# Patient Record
Sex: Male | Born: 1955 | Race: Black or African American | Hispanic: No | Marital: Married | Smoking: Current every day smoker
Health system: Southern US, Community
[De-identification: ages and names within clinical notes are randomized; demographics above are authoritative.]

## PROBLEM LIST (undated history)

## (undated) DIAGNOSIS — M1990 Unspecified osteoarthritis, unspecified site: Secondary | ICD-10-CM

## (undated) DIAGNOSIS — K469 Unspecified abdominal hernia without obstruction or gangrene: Secondary | ICD-10-CM

## (undated) DIAGNOSIS — M109 Gout, unspecified: Secondary | ICD-10-CM

## (undated) DIAGNOSIS — E785 Hyperlipidemia, unspecified: Secondary | ICD-10-CM

## (undated) DIAGNOSIS — E8881 Metabolic syndrome: Secondary | ICD-10-CM

## (undated) DIAGNOSIS — E88819 Insulin resistance, unspecified: Secondary | ICD-10-CM

## (undated) DIAGNOSIS — T7840XA Allergy, unspecified, initial encounter: Secondary | ICD-10-CM

## (undated) HISTORY — DX: Allergy, unspecified, initial encounter: T78.40XA

## (undated) HISTORY — DX: Insulin resistance, unspecified: E88.819

## (undated) HISTORY — DX: Unspecified abdominal hernia without obstruction or gangrene: K46.9

## (undated) HISTORY — DX: Unspecified osteoarthritis, unspecified site: M19.90

## (undated) HISTORY — DX: Gout, unspecified: M10.9

## (undated) HISTORY — DX: Hyperlipidemia, unspecified: E78.5

## (undated) HISTORY — DX: Metabolic syndrome: E88.81

---

## 1998-05-11 ENCOUNTER — Ambulatory Visit (HOSPITAL_COMMUNITY): Admission: RE | Admit: 1998-05-11 | Discharge: 1998-05-11 | Payer: Self-pay | Admitting: Internal Medicine

## 1999-10-29 ENCOUNTER — Emergency Department (HOSPITAL_COMMUNITY): Admission: EM | Admit: 1999-10-29 | Discharge: 1999-10-29 | Payer: Self-pay

## 2001-05-02 ENCOUNTER — Encounter: Payer: Self-pay | Admitting: Internal Medicine

## 2001-05-02 ENCOUNTER — Encounter: Admission: RE | Admit: 2001-05-02 | Discharge: 2001-05-02 | Payer: Self-pay | Admitting: Internal Medicine

## 2001-05-21 ENCOUNTER — Ambulatory Visit (HOSPITAL_COMMUNITY): Admission: RE | Admit: 2001-05-21 | Discharge: 2001-05-21 | Payer: Self-pay | Admitting: Internal Medicine

## 2001-07-02 HISTORY — PX: HERNIA REPAIR: SHX51

## 2003-08-25 ENCOUNTER — Encounter: Admission: RE | Admit: 2003-08-25 | Discharge: 2003-08-25 | Payer: Self-pay | Admitting: Internal Medicine

## 2004-09-11 ENCOUNTER — Ambulatory Visit (HOSPITAL_COMMUNITY): Admission: RE | Admit: 2004-09-11 | Discharge: 2004-09-11 | Payer: Self-pay | Admitting: Cardiology

## 2005-04-25 ENCOUNTER — Ambulatory Visit (HOSPITAL_COMMUNITY): Admission: RE | Admit: 2005-04-25 | Discharge: 2005-04-25 | Payer: Self-pay | Admitting: General Surgery

## 2006-03-20 ENCOUNTER — Encounter: Admission: RE | Admit: 2006-03-20 | Discharge: 2006-03-20 | Payer: Self-pay | Admitting: Internal Medicine

## 2006-12-09 ENCOUNTER — Encounter: Admission: RE | Admit: 2006-12-09 | Discharge: 2006-12-09 | Payer: Self-pay | Admitting: Internal Medicine

## 2010-10-13 ENCOUNTER — Encounter: Payer: Self-pay | Admitting: Internal Medicine

## 2010-11-17 NOTE — Op Note (Signed)
Tyler Perry, Tyler Perry              ACCOUNT NO.:  [id number]   MEDICAL RECORD NO.:  [id number]          PATIENT TYPE:  AMB   LOCATION:  DAY                          FACILITY:  [facility]   PHYSICIAN:  Leonie Man, M.D.   DATE OF BIRTH:  05/03/1956   DATE OF PROCEDURE:  04/25/2005  DATE OF DISCHARGE:                                 OPERATIVE REPORT   PREOPERATIVE DIAGNOSIS:  1.  Right inguinal hernia.  2.  Umbilical hernia   POSTOPERATIVE DIAGNOSIS:  1.  Right inguinal hernia.  2.  Umbilical hernia   PROCEDURE:  1.  Right inguinal hernia repair with mesh using a Prolene hernia system.  2.  Umbilical hernia repair with mesh using the Davol system.   SURGEON:  Leonie Man, M.D.   ASSISTANT:  OR tech.   ANESTHESIA:  General.   SPECIMENS:  No specimens to lab.   ESTIMATED BLOOD LOSS:  Minimal.   COMPLICATIONS:  There no complications during the procedure, and the patient  returned to the PACU in good condition.   INDICATIONS:  The patient is a 55 year old man presenting with a right-sided  groin bulge which has been minimally symptomatic but enlarging and  prolapsing.  He comes to the operating room.  On examination he was also  found to have an incarcerated umbilical hernia.  This has not been causing  any significant symptoms, however.  He understands the risks and potential  benefits of surgery and gives his consent to same.   DESCRIPTION OF PROCEDURE:  Following induction of satisfactory general  anesthesia with the patient positioned supinely, the abdomen is prepped and  draped to be included in a sterile operative field. The right groin was  approached through right lower quadrant incision placed in the lower  abdominal crease, deepened through skin and subcutaneous tissue, down to the  external oblique aponeurosis.  External oblique aponeurosis is opened and  entered with retraction of the ilioinguinal nerve laterally and inferiorly.  The spermatic cord was  elevated and held with a Penrose drain.  On  dissection within the spermatic cord and on the lateral aspect of the  inferior epigastric vessels, there was a moderate size inguinal hernia sac  which was dissected free from the surrounding cord tissues up into the  internal ring.  The sac is reduced into the retroperitoneum and dissection  carried down to the region of the epigastric vessels which were held with an  Allis clamp.  The preperitoneal fat was then dissected free, and the  preperitoneal space entered.  I used a large Prolene hernia system mesh  which was then deployed into the retroperitoneum and in the inferior,  superior, lateral and medial positions.  The external portion was then  opened up to the inguinal ring, and the mesh was cut so as to allow  protrusion of the spermatic cord through the mesh.  The mesh was then  sutured down to the cubic tubercle with 2-0 Novofil suture running the  suture line up along the conjoined tendon to the internal ring and again  from the pubic tubercle along the shelving  edge of Poupart's ligament up to  the internal ring.  The remaining portion of the mesh was then deployed  under the external aponeurosis.  All areas of dissection were then checked  for hemostasis and noted be dry. Sponge, instrument and sharp counts were  verified. External oblique aponeurosis was  closed over the cord with a  running 2-0 Vicryl suture.  The Scarpa's fascia was closed with a running 3-  0 Vicryl suture, and the skin was closed with a running 4-0 Monocryl suture  placed intradermally.   Attention was then turned to the umbilical hernia which was then approached  through an infraumbilical curvilinear incision.  The umbilicus was raised  superiorly as a flap and dissection carried down to the defect in the  abdominal wall.  This was a rather large defect causing needing dissection  both superiorly, laterally and inferiorly.  All of the incarcerated omentum   were was reduced back into the peritoneal cavity and all of the l adhesions  from the hernia sac were taken down.  I deployed a Dietitian  into the peritoneum and closed the defect over the patch off to the extruded  tapes.  The tapes were then cut and sutured into the fascia.  This completed  the repair of the hernia. Sponge, instrument and sharp counts were again  verified and the wound was closed in layers as follows. The subcutaneous  tissue were closed with interrupted 3-0 Vicryl sutures. Skin closed with 4-0  Monocryl suture.  A sterile compressive dressing was then placed on the  wound after Steri-Strips were placed on both incisions.      Leonie Man, M.D.  Electronically Signed     PB/MEDQ  D:  04/25/2005  T:  04/25/2005  Job:  [id number]

## 2011-01-23 ENCOUNTER — Ambulatory Visit
Admission: RE | Admit: 2011-01-23 | Discharge: 2011-01-23 | Disposition: A | Discharge status: Home / Self Care | Payer: BC Managed Care – PPO | Source: Ambulatory Visit | Attending: Internal Medicine | Admitting: Internal Medicine

## 2011-01-23 ENCOUNTER — Other Ambulatory Visit: Payer: Self-pay | Admitting: Internal Medicine

## 2011-01-23 DIAGNOSIS — R05 Cough: Secondary | ICD-10-CM

## 2011-01-23 DIAGNOSIS — R059 Cough, unspecified: Secondary | ICD-10-CM

## 2011-04-17 ENCOUNTER — Other Ambulatory Visit: Payer: Self-pay | Admitting: Internal Medicine

## 2011-04-17 DIAGNOSIS — K458 Other specified abdominal hernia without obstruction or gangrene: Secondary | ICD-10-CM

## 2011-04-19 ENCOUNTER — Other Ambulatory Visit: Payer: BC Managed Care – PPO

## 2011-04-20 ENCOUNTER — Ambulatory Visit
Admission: RE | Admit: 2011-04-20 | Discharge: 2011-04-20 | Disposition: A | Discharge status: Home / Self Care | Payer: BC Managed Care – PPO | Source: Ambulatory Visit | Attending: Internal Medicine | Admitting: Internal Medicine

## 2011-04-20 DIAGNOSIS — K458 Other specified abdominal hernia without obstruction or gangrene: Secondary | ICD-10-CM

## 2011-06-04 ENCOUNTER — Encounter (INDEPENDENT_AMBULATORY_CARE_PROVIDER_SITE_OTHER): Payer: BC Managed Care – PPO | Admitting: General Surgery

## 2011-07-09 ENCOUNTER — Encounter (INDEPENDENT_AMBULATORY_CARE_PROVIDER_SITE_OTHER): Payer: BC Managed Care – PPO | Admitting: General Surgery

## 2011-08-13 ENCOUNTER — Encounter (INDEPENDENT_AMBULATORY_CARE_PROVIDER_SITE_OTHER): Payer: BC Managed Care – PPO | Admitting: General Surgery

## 2011-08-27 ENCOUNTER — Encounter (INDEPENDENT_AMBULATORY_CARE_PROVIDER_SITE_OTHER): Payer: BC Managed Care – PPO | Admitting: General Surgery

## 2011-08-29 ENCOUNTER — Encounter (INDEPENDENT_AMBULATORY_CARE_PROVIDER_SITE_OTHER): Payer: Self-pay | Admitting: General Surgery

## 2011-08-29 ENCOUNTER — Ambulatory Visit (INDEPENDENT_AMBULATORY_CARE_PROVIDER_SITE_OTHER): Payer: BC Managed Care – PPO | Admitting: General Surgery

## 2011-08-29 VITALS — BP 136/88 | HR 72 | Temp 98.0°F | Resp 16 | Ht 68.0 in | Wt 194.4 lb

## 2011-08-29 DIAGNOSIS — K439 Ventral hernia without obstruction or gangrene: Secondary | ICD-10-CM

## 2011-08-29 NOTE — Progress Notes (Signed)
Patient ID: Tyler Perry, male   DOB: 08/25/1955, 56 y.o.   MRN: [id number]  Chief Complaint  Patient presents with  . Hernia    est pr new prob- eval abd wall hernia    HPI Tyler Perry is a 56 y.o. male.   HPI  He is referred by Dr. Willey Blade for evaluation of abdominal wall hernias. Back in the fall he developed some severe epigastric pain. He underwent a CT scan which demonstrated 2 small epigastric ventral hernias containing fat. Since that episode he has not had near as much pain. He has some constipation but is now taking MiraLax and that has improved. He has had an umbilical hernia apparent the past. No obstructive symptoms. No difficulty with urination. No chronic cough. He does a lot of heavy lifting at work and used to work out a lot at [employer]  Past Medical History  Diagnosis Date  . Hyperlipidemia   . Gout, unspecified   . Allergy   . Osteoarthrosis, unspecified whether generalized or localized, unspecified site   . Hernia     Past Surgical History  Procedure Date  . Hernia repair     umb hernia    Family History  Problem Relation Age of Onset  . Cancer Mother     bone  . Heart disease Father   . Cancer Maternal Aunt     unaware    Social History History  Substance Use Topics  . Smoking status: Former Smoker    Quit date: 07/03/2003  . Smokeless tobacco: Not on file  . Alcohol Use: No    No Known Allergies  Current Outpatient Prescriptions  Medication Sig Dispense Refill  . allopurinol (ZYLOPRIM) 300 MG tablet Take 300 mg by mouth daily.        Marland Kitchen aspirin 81 MG tablet Take 81 mg by mouth daily.        Marland Kitchen azelastine (ASTELIN) 137 MCG/SPRAY nasal spray BID times 48H.      . cetirizine (ZYRTEC) 10 MG tablet Take 10 mg by mouth daily.        Marland Kitchen lovastatin (MEVACOR) 40 MG tablet Take 40 mg by mouth daily.        . Multiple Vitamin (MULTIVITAMIN) tablet Take 1 tablet by mouth daily.          Review of Systems Review of Systems  Constitutional:  Negative.   Respiratory: Negative.   Cardiovascular: Negative.   Gastrointestinal: Positive for abdominal pain, constipation and abdominal distention (sees small bulges sometimes).  Genitourinary: Negative.   Neurological: Negative.   Hematological: Negative.     Blood pressure 136/88, pulse 72, temperature 98 F (36.7 C), temperature source Temporal, resp. rate 16, height 5\' 8"  (1.727 m), weight 194 lb 6.4 oz (88.179 kg).  Physical Exam Physical Exam  Constitutional:       Overweight male in NAD.  HENT:  Head: Normocephalic and atraumatic.  Cardiovascular: Normal rate and regular rhythm.   Pulmonary/Chest: Effort normal and breath sounds normal.  Abdominal: Soft. He exhibits no distension and no mass. There is no tenderness.       There is a small subumbilical scar present.  Superior to the umbilicus there are 2 palpable fascial defects with reducible material.  Musculoskeletal: Normal range of motion. He exhibits no edema.    Data Reviewed Dr. Diamantina Providence note.  CT scan.  Assessment    2 ventral epigastric hernias that led to a symptomatic episode. These both contained fat and  are not incarcerated. He is interested in repair.    Plan    We discussed laparoscopic and open ventral hernia repairs using a mesh. He is interested in a laparoscopic repair. He would like to talk to Dr. August Saucer about this. I told him he could call back if he wanted to schedule the repair.  I have discussed the procedure, risks, and aftercare. Risks include but are not limited to bleeding, infection, wound healing problems, anesthesia, recurrence, accidental injury to intra-abdominal organs-such as intestine, liver, spleen, bladder, etc.   All questions were answered.       Felesha Moncrieffe J 08/29/2011, 11:17 AM

## 2011-08-29 NOTE — Patient Instructions (Signed)
Call us when you would like to schedule your surgery.

## 2011-10-01 ENCOUNTER — Telehealth (INDEPENDENT_AMBULATORY_CARE_PROVIDER_SITE_OTHER): Payer: Self-pay | Admitting: General Surgery

## 2011-10-03 ENCOUNTER — Telehealth (INDEPENDENT_AMBULATORY_CARE_PROVIDER_SITE_OTHER): Payer: Self-pay | Admitting: General Surgery

## 2011-10-10 ENCOUNTER — Other Ambulatory Visit (INDEPENDENT_AMBULATORY_CARE_PROVIDER_SITE_OTHER): Payer: Self-pay | Admitting: General Surgery

## 2011-10-15 ENCOUNTER — Telehealth (INDEPENDENT_AMBULATORY_CARE_PROVIDER_SITE_OTHER): Payer: Self-pay

## 2011-10-15 NOTE — Telephone Encounter (Signed)
LMOV pt to call when ready to schedule surgery sometime in August.  He will need to be seen for an office visit with Dr. Abbey Chatters at that time.

## 2012-05-19 ENCOUNTER — Ambulatory Visit
Admission: RE | Admit: 2012-05-19 | Discharge: 2012-05-19 | Disposition: A | Discharge status: Home / Self Care | Payer: BC Managed Care – PPO | Source: Ambulatory Visit | Attending: Internal Medicine | Admitting: Internal Medicine

## 2012-05-19 ENCOUNTER — Other Ambulatory Visit: Payer: Self-pay | Admitting: Internal Medicine

## 2012-05-19 DIAGNOSIS — M542 Cervicalgia: Secondary | ICD-10-CM

## 2012-05-19 DIAGNOSIS — J40 Bronchitis, not specified as acute or chronic: Secondary | ICD-10-CM

## 2012-08-26 ENCOUNTER — Ambulatory Visit (INDEPENDENT_AMBULATORY_CARE_PROVIDER_SITE_OTHER): Payer: BC Managed Care – PPO | Admitting: Physician Assistant

## 2012-08-26 VITALS — BP 149/84 | HR 68 | Temp 98.5°F | Resp 16 | Ht 67.0 in | Wt 197.6 lb

## 2012-08-26 DIAGNOSIS — R05 Cough: Secondary | ICD-10-CM

## 2012-08-26 DIAGNOSIS — J019 Acute sinusitis, unspecified: Secondary | ICD-10-CM

## 2012-08-26 DIAGNOSIS — R059 Cough, unspecified: Secondary | ICD-10-CM

## 2012-08-26 MED ORDER — AMOXICILLIN 875 MG PO TABS: 875.0000 mg | ORAL_TABLET | Freq: Two times a day (BID) | ORAL | Status: DC

## 2012-08-26 MED ORDER — IPRATROPIUM BROMIDE 0.03 % NA SOLN: 2.0000 | Freq: Two times a day (BID) | NASAL | Status: DC

## 2012-08-26 MED ORDER — HYDROCODONE-HOMATROPINE 5-1.5 MG/5ML PO SYRP: 5.0000 mL | ORAL_SOLUTION | Freq: Three times a day (TID) | ORAL | Status: DC | PRN

## 2012-08-26 NOTE — Progress Notes (Signed)
  Subjective:    Patient ID: Tyler Perry, male    DOB: Mar 16, 1956, 57 y.o.   MRN: [id number]  HPI 57 year old male presents with 5 day history of nasal congestion, sinus pressure, postnasal drainage, clear rhinorrhea, and nonproductive cough.  He has been taking Mucinex and cetirizine which does not seem to be helping much.  Denies nausea, vomiting, dizziness, headache, otalgia, sore throat or sinus pain.       Review of Systems  Constitutional: Negative for fever and chills.  HENT: Positive for congestion, rhinorrhea, postnasal drip and sinus pressure. Negative for ear pain, sore throat and ear discharge.   Respiratory: Positive for cough. Negative for shortness of breath and wheezing.   Gastrointestinal: Negative for nausea, vomiting and abdominal pain.  Neurological: Negative for dizziness, light-headedness and headaches.       Objective:   Physical Exam  Constitutional: He is oriented to person, place, and time. He appears well-developed and well-nourished.  HENT:  Head: Normocephalic and atraumatic.  Right Ear: Hearing, tympanic membrane, external ear and ear canal normal.  Left Ear: Hearing, tympanic membrane, external ear and ear canal normal.  Mouth/Throat: Uvula is midline, oropharynx is clear and moist and mucous membranes are normal. No oropharyngeal exudate.  Eyes: Conjunctivae are normal.  Neck: Normal range of motion. Neck supple.  Cardiovascular: Normal rate, regular rhythm and normal heart sounds.   Pulmonary/Chest: Effort normal and breath sounds normal.  Lymphadenopathy:    He has no cervical adenopathy.  Neurological: He is alert and oriented to person, place, and time.  Psychiatric: He has a normal mood and affect. His behavior is normal. Judgment and thought content normal.          Assessment & Plan:  1. Sinusitis, acute - Plan: amoxicillin (AMOXIL) 875 MG tablet, ipratropium (ATROVENT) 0.03 % nasal spray  -Amoxicillin 875 mg bid x 10 days  -Atrovent  NS bid to help with congestion  -Continue Zyrtec daily 2. Cough - Plan: HYDROcodone-homatropine (HYCODAN) 5-1.5 MG/5ML syrup  -Hycodan qhs prn cough  -Cautioned of sedating effects  -Follow up if symptoms worsen or fail to improve.

## 2012-09-01 ENCOUNTER — Ambulatory Visit (INDEPENDENT_AMBULATORY_CARE_PROVIDER_SITE_OTHER): Payer: BC Managed Care – PPO | Admitting: General Surgery

## 2013-06-10 ENCOUNTER — Ambulatory Visit (INDEPENDENT_AMBULATORY_CARE_PROVIDER_SITE_OTHER): Payer: BC Managed Care – PPO | Admitting: Physician Assistant

## 2013-06-10 VITALS — BP 110/72 | HR 76 | Temp 98.6°F | Resp 18 | Ht 67.0 in | Wt 188.0 lb

## 2013-06-10 DIAGNOSIS — J019 Acute sinusitis, unspecified: Secondary | ICD-10-CM

## 2013-06-10 DIAGNOSIS — R05 Cough: Secondary | ICD-10-CM

## 2013-06-10 DIAGNOSIS — R059 Cough, unspecified: Secondary | ICD-10-CM

## 2013-06-10 MED ORDER — AMOXICILLIN 875 MG PO TABS: 875.0000 mg | ORAL_TABLET | Freq: Two times a day (BID) | ORAL | Status: AC

## 2013-06-10 MED ORDER — HYDROCODONE-HOMATROPINE 5-1.5 MG/5ML PO SYRP: 5.0000 mL | ORAL_SOLUTION | Freq: Three times a day (TID) | ORAL | Status: DC | PRN

## 2013-06-10 NOTE — Patient Instructions (Signed)
Continue the cetirizine and Astelin nasal spray. Restart the Mucinex to help thin the mucous. Get plenty of rest and drink at least 64 ounces of water daily.

## 2013-06-10 NOTE — Progress Notes (Signed)
   Subjective:    Patient ID: Tyler Perry, male    DOB: 1956/02/14, 57 y.o.   MRN: [id number]  PCP: Willey Blade, MD  Chief Complaint  Patient presents with  . Cough    chest pain with coughing started sunday    Medications, allergies, past medical history, surgical history, family history, social history and problem list reviewed and updated.  HPI Symptoms began on 06/07/2013.  37 year old grandson had similar symptoms.  Traveled to [facility] on 06/08/2013 and back 12/09 (last night).  Is feeling worse.  Nasal congestion, drainage, sore throat.  No ear pain or fullness. Cough is non-productive. Yesterday the cough was painful, today it's some better. HA yesterday, none today. No fever, chills, nausea, vomiting.  A little bit of diarrhea this morning. No unexplained myalgias/arthralgias, but feels stiff after the travel.   Review of Systems As above.    Objective:   Physical Exam  Vitals reviewed. Constitutional: He is oriented to person, place, and time. Vital signs are normal. He appears well-developed and well-nourished. No distress.  HENT:  Head: Normocephalic and atraumatic.  Right Ear: Hearing, tympanic membrane, external ear and ear canal normal.  Left Ear: Hearing, tympanic membrane, external ear and ear canal normal.  Nose: Mucosal edema and rhinorrhea present.  No foreign bodies. Right sinus exhibits no maxillary sinus tenderness and no frontal sinus tenderness. Left sinus exhibits no maxillary sinus tenderness and no frontal sinus tenderness.  Mouth/Throat: Uvula is midline, oropharynx is clear and moist and mucous membranes are normal. No uvula swelling. No oropharyngeal exudate.  Eyes: Conjunctivae and EOM are normal. Pupils are equal, round, and reactive to light. Right eye exhibits no discharge. Left eye exhibits no discharge. No scleral icterus.  Neck: Trachea normal, normal range of motion and full passive range of motion without pain. Neck supple. No mass and  no thyromegaly present.  Cardiovascular: Normal rate, regular rhythm and normal heart sounds.   Pulmonary/Chest: Effort normal and breath sounds normal.  Lymphadenopathy:       Head (right side): No submandibular, no tonsillar, no preauricular, no posterior auricular and no occipital adenopathy present.       Head (left side): No submandibular, no tonsillar, no preauricular and no occipital adenopathy present.    He has no cervical adenopathy.       Right: No supraclavicular adenopathy present.       Left: No supraclavicular adenopathy present.  Neurological: He is alert and oriented to person, place, and time. He has normal strength. No cranial nerve deficit or sensory deficit.  Skin: Skin is warm, dry and intact. No rash noted.  Psychiatric: He has a normal mood and affect. His speech is normal and behavior is normal.          Assessment & Plan:  1. Sinusitis, acute - amoxicillin (AMOXIL) 875 MG tablet; Take 1 tablet (875 mg total) by mouth 2 (two) times daily.  Dispense: 20 tablet; Refill: 0  2. Cough - HYDROcodone-homatropine (HYCODAN) 5-1.5 MG/5ML syrup; Take 5 mLs by mouth every 8 (eight) hours as needed for cough.  Dispense: 120 mL; Refill: 0  Supportive care.  Anticipatory guidance.  RTC if symptoms worsen/persist.  Fernande Bras, PA-C Physician Assistant-Certified [facility]

## 2013-06-18 ENCOUNTER — Telehealth: Payer: Self-pay

## 2013-06-18 NOTE — Telephone Encounter (Signed)
FMLA paperwork in Tyler Perry's box to be completed.

## 2013-06-29 NOTE — Telephone Encounter (Signed)
[facility] ready for pickup. Faxed one page but patient needs to complete other page. LMOM.

## 2013-07-27 DIAGNOSIS — Z0271 Encounter for disability determination: Secondary | ICD-10-CM

## 2013-08-06 ENCOUNTER — Encounter: Payer: Self-pay | Admitting: Physician Assistant

## 2014-01-10 IMAGING — CR DG CHEST 2V
2 series · 2 of 2 positions shown · non-contrast
Comparison: Chest x-ray of 01/23/1999

CLINICAL DATA: Recent aspiration, residual cough and congestion

CHEST - 2 VIEW

[view not recorded (1 of 2)]
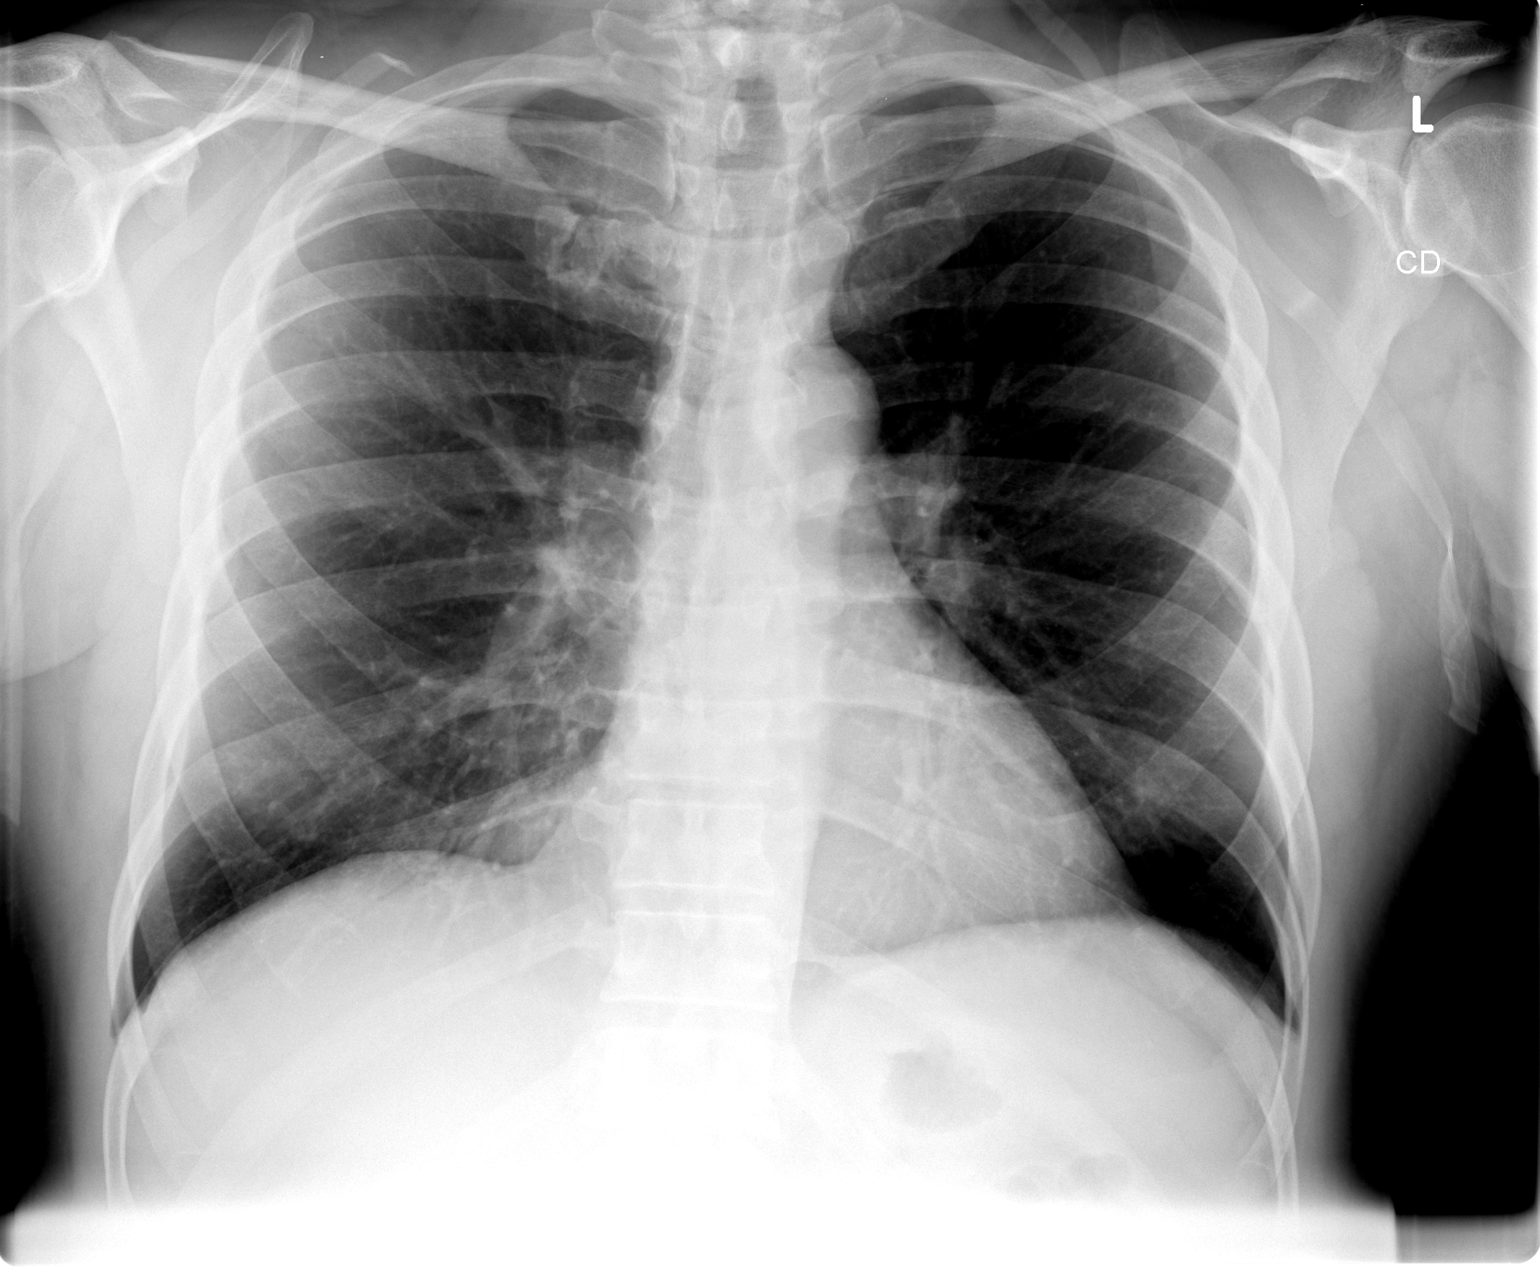

[view not recorded (2 of 2)]
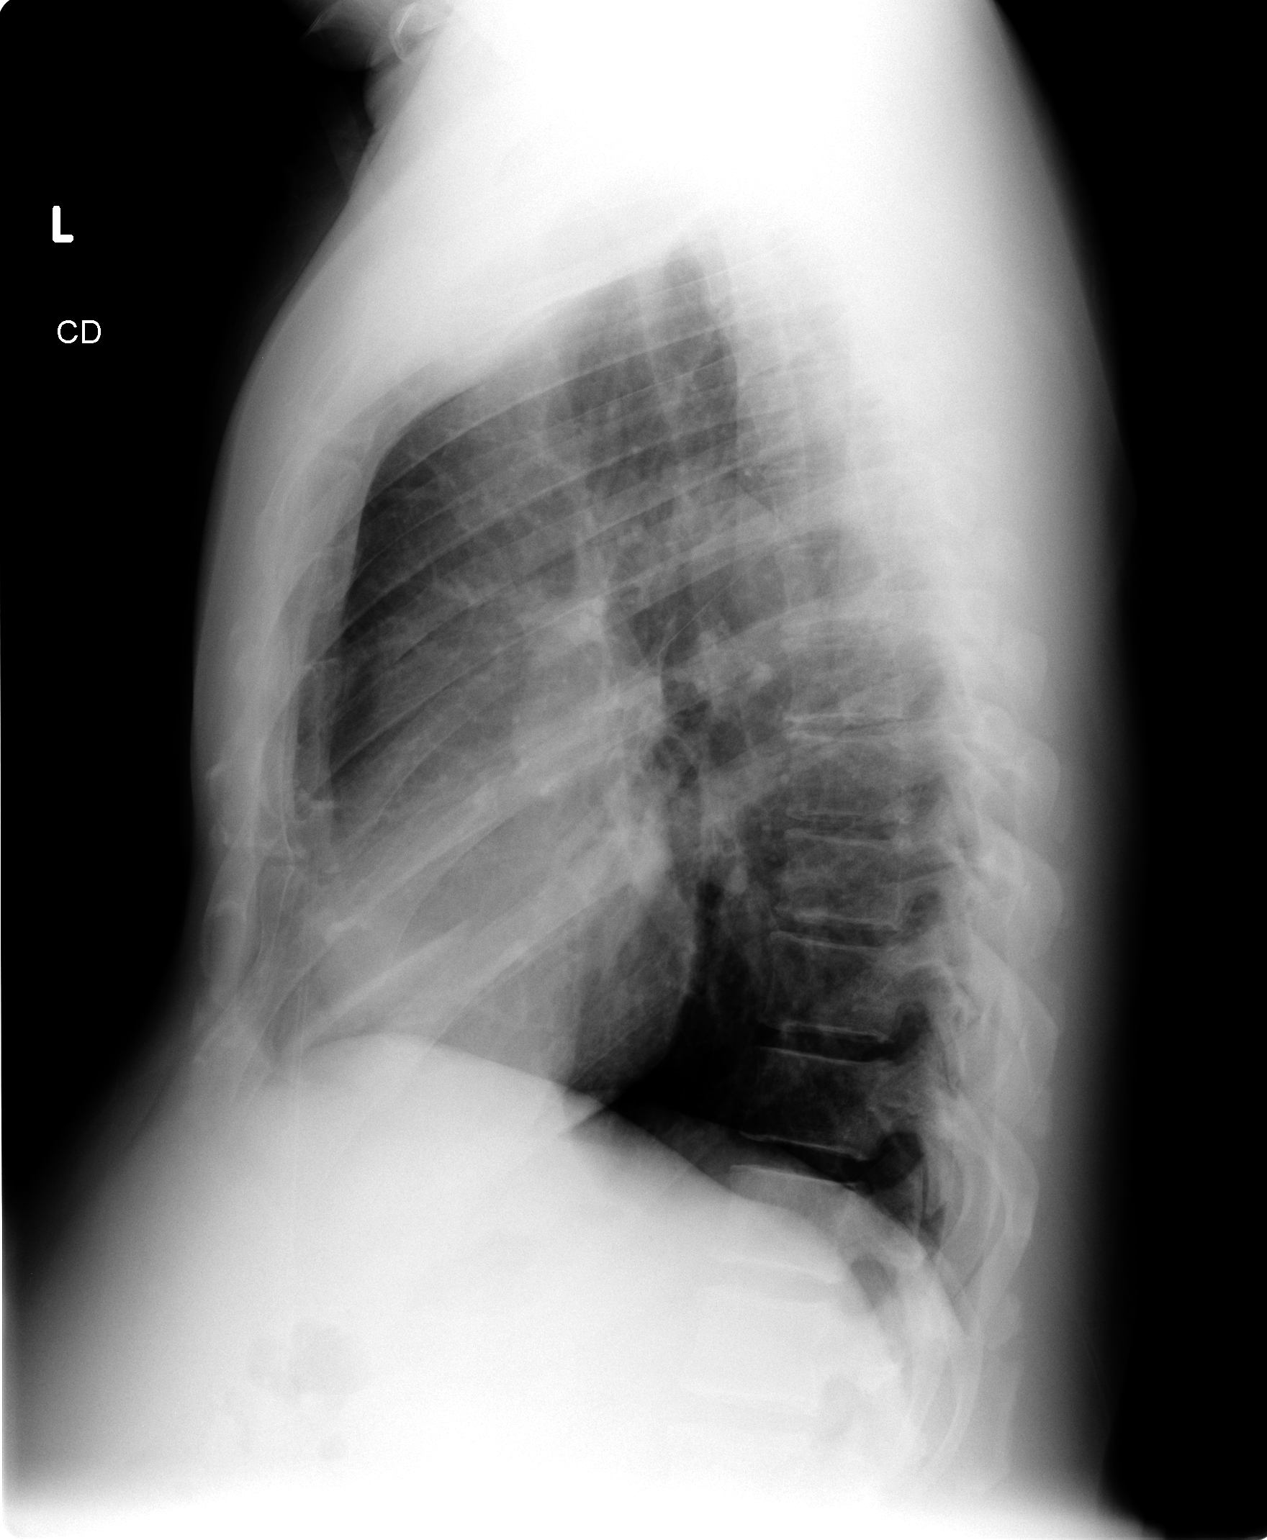

[2 of 2 positions shown; findings below may reference images not displayed]

FINDINGS: No pneumonia is seen.  There is peribronchial thickening
consistent with bronchitis.  Mediastinal contours are stable.  The
heart is within normal limits in size.  No bony abnormality is
seen.
IMPRESSION: No pneumonia.  Persistent peribronchial thickening may indicate
bronchitis.

## 2014-01-10 IMAGING — CR DG CERVICAL SPINE COMPLETE 4+V
6 series · 6 of 6 positions shown · non-contrast
Comparison: None.

CLINICAL DATA: Posterior neck pain and soreness

CERVICAL SPINE - COMPLETE 4+ VIEW

[view not recorded (1 of 6)]
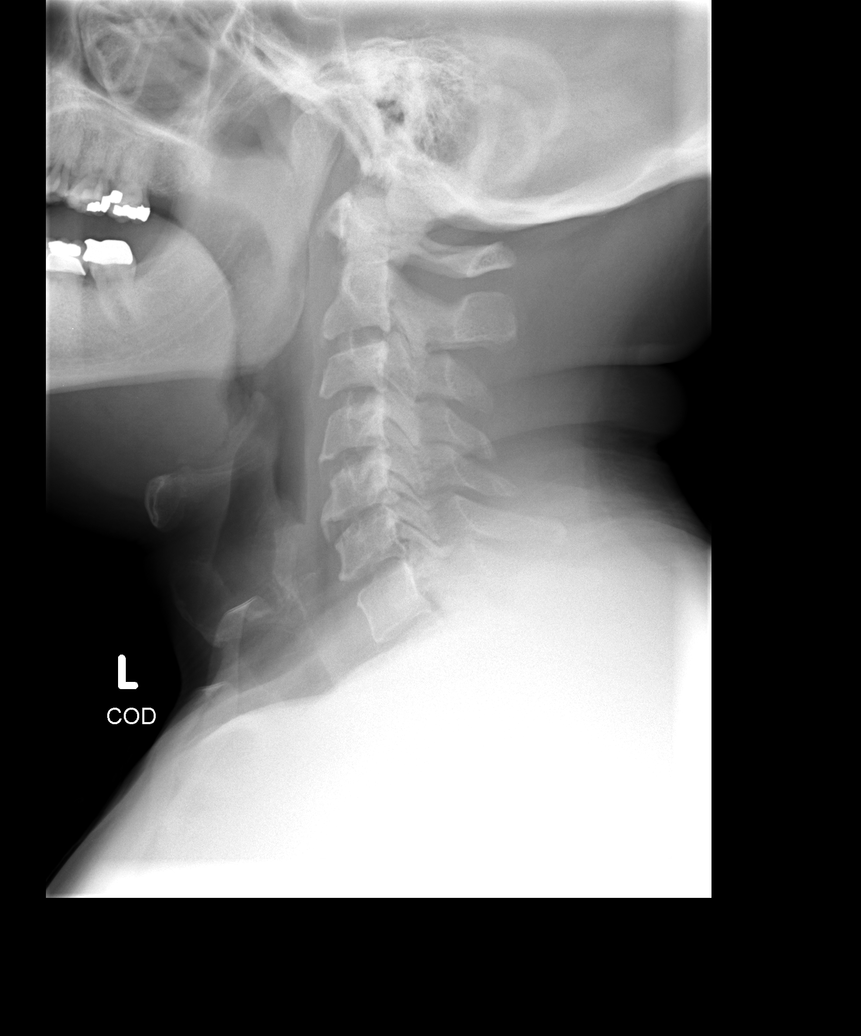

[view not recorded (2 of 6)]
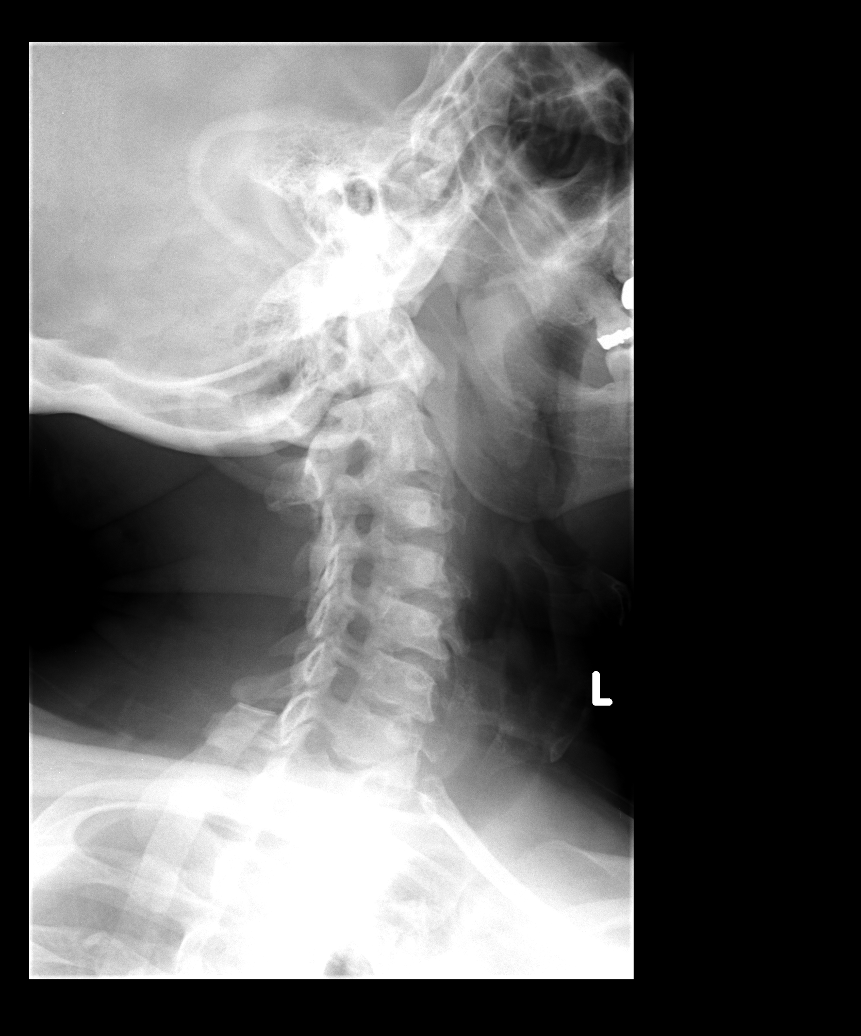

[view not recorded (3 of 6)]
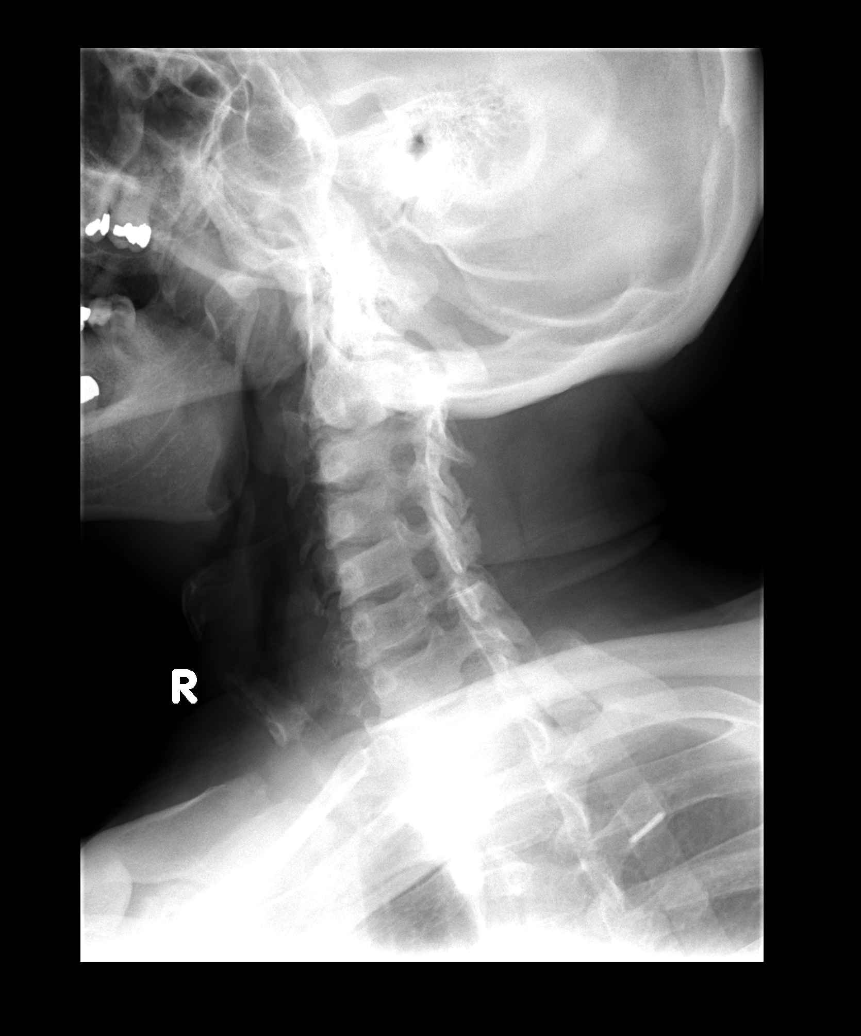

[view not recorded (4 of 6)]
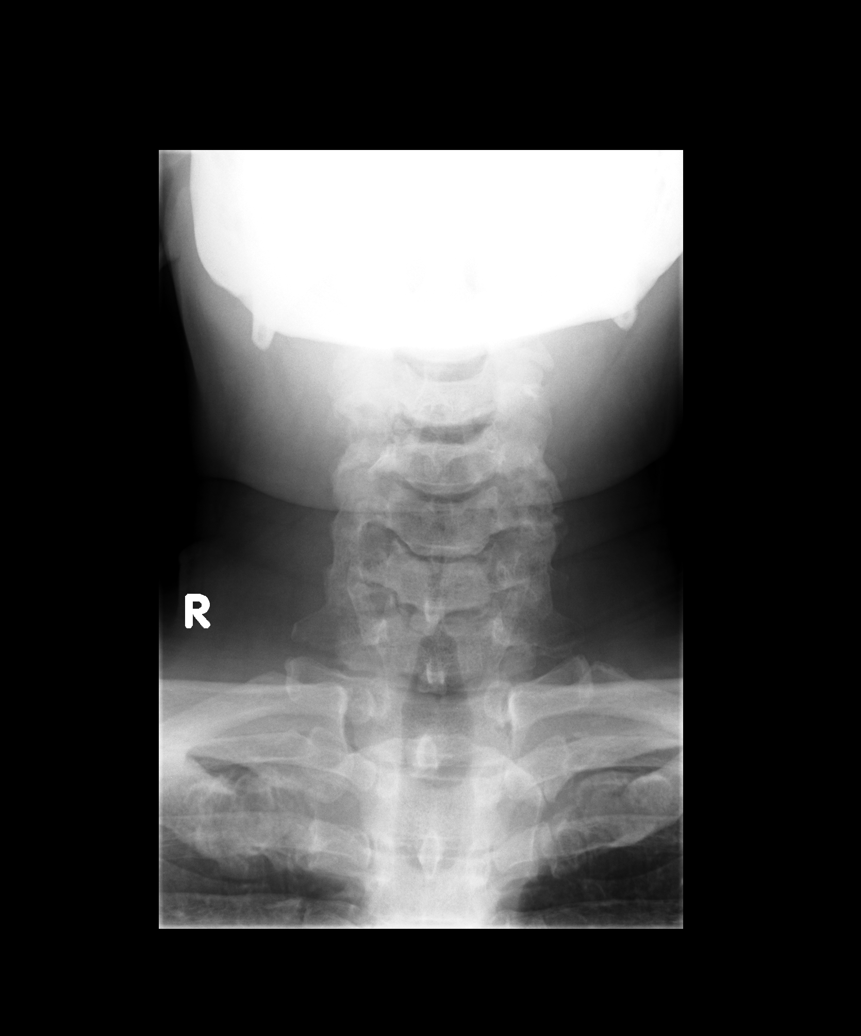

[view not recorded (5 of 6)]
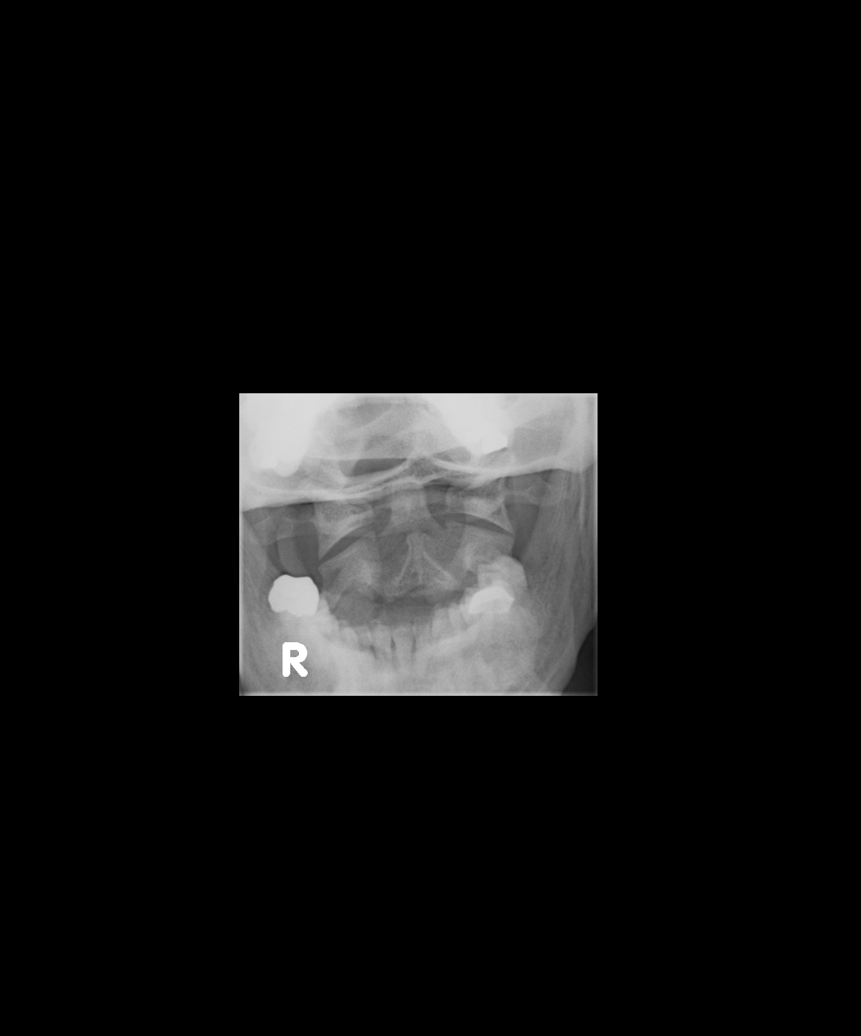

[view not recorded (6 of 6)]
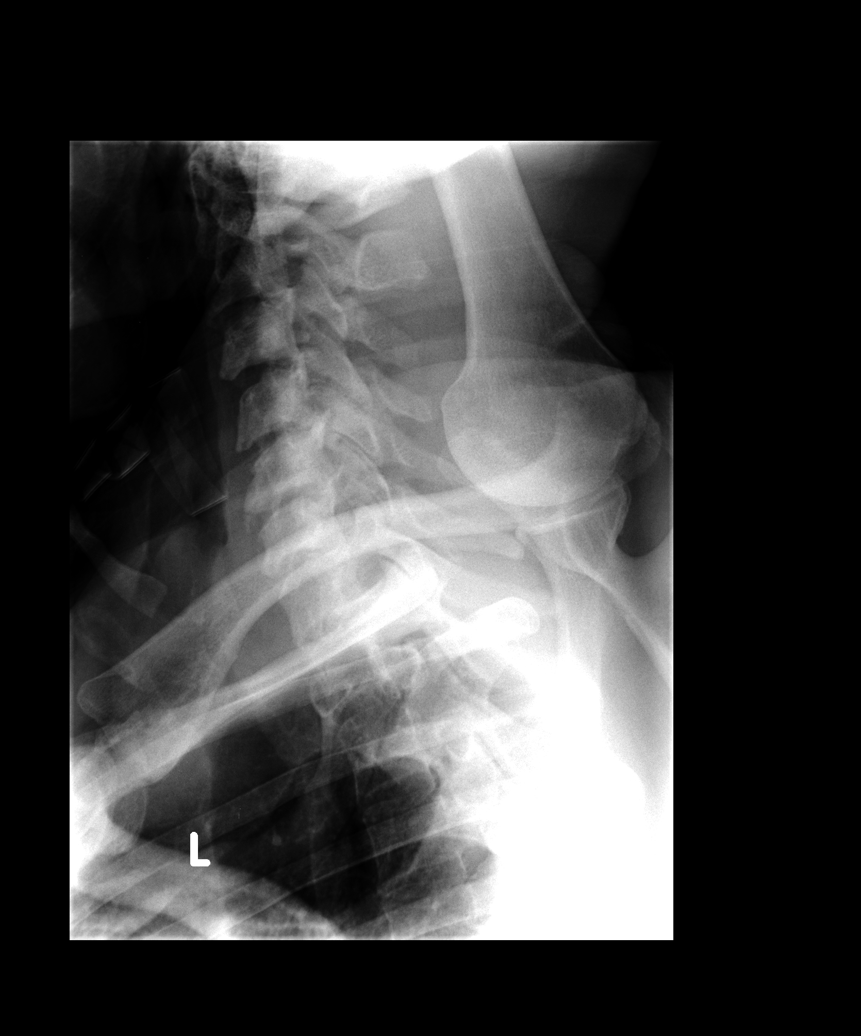

[6 of 6 positions shown; findings below may reference images not displayed]

FINDINGS: The cervical vertebrae are in normal alignment.  There is
mild degenerative change at the C5-6 level with slight loss of disc
space and spurring anteriorly and posteriorly.  The remainder of
intervertebral disc spaces are grossly normal.  No prevertebral
soft tissue swelling is seen.  On oblique views no significant
foraminal narrowing is seen.  The odontoid process is intact.  The
lung apices are clear.
IMPRESSION: Normal alignment.  Degenerative disc disease at C5-6.

## 2014-08-18 ENCOUNTER — Encounter (HOSPITAL_COMMUNITY): Payer: Self-pay | Admitting: *Deleted

## 2014-08-18 ENCOUNTER — Emergency Department (HOSPITAL_COMMUNITY)
Admission: EM | Admit: 2014-08-18 | Discharge: 2014-08-18 | Disposition: A | Discharge status: Home / Self Care | Payer: BLUE CROSS/BLUE SHIELD | Source: Home / Self Care | Attending: Family Medicine | Admitting: Family Medicine

## 2014-08-18 DIAGNOSIS — S29012A Strain of muscle and tendon of back wall of thorax, initial encounter: Secondary | ICD-10-CM

## 2014-08-18 DIAGNOSIS — S29019A Strain of muscle and tendon of unspecified wall of thorax, initial encounter: Secondary | ICD-10-CM

## 2014-08-18 MED ORDER — TETANUS-DIPHTH-ACELL PERTUSSIS 5-2.5-18.5 LF-MCG/0.5 IM SUSP
INTRAMUSCULAR | Status: AC
  Filled 2014-08-18: qty 0.5

## 2014-08-18 MED ORDER — TETANUS-DIPHTH-ACELL PERTUSSIS 5-2.5-18.5 LF-MCG/0.5 IM SUSP
0.5000 mL | Freq: Once | INTRAMUSCULAR | Status: AC
  Administered 2014-08-18: 0.5 mL via INTRAMUSCULAR

## 2014-08-18 MED ORDER — KETOROLAC TROMETHAMINE 30 MG/ML IJ SOLN
INTRAMUSCULAR | Status: AC
  Filled 2014-08-18: qty 1

## 2014-08-18 MED ORDER — KETOROLAC TROMETHAMINE 30 MG/ML IJ SOLN
30.0000 mg | Freq: Once | INTRAMUSCULAR | Status: AC
  Administered 2014-08-18: 30 mg via INTRAMUSCULAR

## 2014-08-18 NOTE — ED Provider Notes (Signed)
CSN: [id number]     Arrival date & time 08/18/14  1601 History   First MD Initiated Contact with Patient 08/18/14 1740     Chief Complaint  Patient presents with  . Back Pain   (Consider location/radiation/quality/duration/timing/severity/associated sxs/prior Treatment) HPI Comments: Patient states he injured his back when trying to lift a large bag of rock salt out of his shed over a bicycle. Fell against a set of shelves in the shed and suffered a small laceration to his right forearm Last tetanus booster unknown.   Patient is a 59 y.o. male presenting with back pain. The history is provided by the patient.  Back Pain   Past Medical History  Diagnosis Date  . Hyperlipidemia   . Gout, unspecified   . Allergy   . Osteoarthrosis, unspecified whether generalized or localized, unspecified site   . Hernia   . Insulin resistance    Past Surgical History  Procedure Laterality Date  . Hernia repair  2003    umb hernia   Family History  Problem Relation Age of Onset  . Cancer Mother     bone  . Heart disease Father   . Cancer Maternal Aunt     unaware   History  Substance Use Topics  . Smoking status: Former Smoker    Quit date: 07/03/2003  . Smokeless tobacco: Never Used  . Alcohol Use: No    Review of Systems  Musculoskeletal: Positive for back pain.  All other systems reviewed and are negative.   Allergies  Review of patient's allergies indicates no known allergies.  Home Medications   Prior to Admission medications   Medication Sig Start Date End Date Taking? Authorizing Provider  aspirin 81 MG tablet Take 81 mg by mouth daily.     Yes Historical Provider, MD  cetirizine (ZYRTEC) 10 MG tablet Take 10 mg by mouth daily.   Yes Historical Provider, MD  lovastatin (MEVACOR) 40 MG tablet Take 20 mg by mouth daily.    Yes Historical Provider, MD  Multiple Vitamin (MULTIVITAMIN) tablet Take 1 tablet by mouth daily.     Yes Historical Provider, MD  azelastine  (ASTELIN) 137 MCG/SPRAY nasal spray BID times 48H. 08/05/11   Historical Provider, MD  HYDROcodone-homatropine (HYCODAN) 5-1.5 MG/5ML syrup Take 5 mLs by mouth every 8 (eight) hours as needed for cough. 06/10/13   Chelle S Jeffery, PA-C   BP 133/83 mmHg  Pulse 82  Temp(Src) 99.3 F (37.4 C) (Oral)  Resp 18  SpO2 98% Physical Exam  Constitutional: He is oriented to person, place, and time. He appears well-developed and well-nourished.  HENT:  Head: Normocephalic and atraumatic.  Cardiovascular: Normal rate, regular rhythm and normal heart sounds.   Pulmonary/Chest: Effort normal and breath sounds normal.  Musculoskeletal: Normal range of motion.       Thoracic back: He exhibits normal range of motion, no bony tenderness, no swelling, no edema, no deformity and no laceration.       Back:       Arms: Outlined areas are regions of mild discomfort with palpation and ROM of torso  Neurological: He is alert and oriented to person, place, and time.  Skin: Skin is warm and dry. No rash noted. No erythema.  Psychiatric: He has a normal mood and affect. His behavior is normal.  Nursing note and vitals reviewed.   ED Course  Procedures (including critical care time) Labs Review Labs Reviewed - No data to display  Imaging Review No results found.  MDM   1. Strain of thoracic paraspinal muscles excluding T1 and T2 levels, initial encounter   Patient given  IM dose of toradol for pain and Tdap for superficial laceration at right forearm. Exam benign. Mild myofascial strain bilateral thoracic paraspinal muscles. Advised to continue using NSAIDs at home and expect improvement over next few days. Cautioned to limit heavy lifting over next 3-4 days. Follow up PCP if no improvement.     Ria Clock, [place] 08/18/14 [phone]

## 2014-08-18 NOTE — Discharge Instructions (Signed)
I would advise you to continue to use naprosyn (Aleve) or ibuprofen as directed on the packaging for pain and limit lifting >25lbs for the next 3-4 days. You can expect improvement over the next 4-5 days. If symptoms do not improve, please follow up with your doctor.  Back Exercises Back exercises help treat and prevent back injuries. The goal of back exercises is to increase the strength of your abdominal and back muscles and the flexibility of your back. These exercises should be started when you no longer have back pain. Back exercises include:  Pelvic Tilt. Lie on your back with your knees bent. Tilt your pelvis until the lower part of your back is against the floor. Hold this position 5 to 10 sec and repeat 5 to 10 times.  Knee to Chest. Pull first 1 knee up against your chest and hold for 20 to 30 seconds, repeat this with the other knee, and then both knees. This may be done with the other leg straight or bent, whichever feels better.  Sit-Ups or Curl-Ups. Bend your knees 90 degrees. Start with tilting your pelvis, and do a partial, slow sit-up, lifting your trunk only 30 to 45 degrees off the floor. Take at least 2 to 3 seconds for each sit-up. Do not do sit-ups with your knees out straight. If partial sit-ups are difficult, simply do the above but with only tightening your abdominal muscles and holding it as directed.  Hip-Lift. Lie on your back with your knees flexed 90 degrees. Push down with your feet and shoulders as you raise your hips a couple inches off the floor; hold for 10 seconds, repeat 5 to 10 times.  Back arches. Lie on your stomach, propping yourself up on bent elbows. Slowly press on your hands, causing an arch in your low back. Repeat 3 to 5 times. Any initial stiffness and discomfort should lessen with repetition over time.  Shoulder-Lifts. Lie face down with arms beside your body. Keep hips and torso pressed to floor as you slowly lift your head and shoulders off the  floor. Do not overdo your exercises, especially in the beginning. Exercises may cause you some mild back discomfort which lasts for a few minutes; however, if the pain is more severe, or lasts for more than 15 minutes, do not continue exercises until you see your caregiver. Improvement with exercise therapy for back problems is slow.  See your caregivers for assistance with developing a proper back exercise program. Document Released: 07/26/2004 Document Revised: 09/10/2011 Document Reviewed: 04/19/2011 [facility] Patient Information 2015 [place], [place]. This information is not intended to replace advice given to you by your health care provider. Make sure you discuss any questions you have with your health care provider.  Back Pain, Adult Low back pain is very common. About 1 in 5 people have back pain.The cause of low back pain is rarely dangerous. The pain often gets better over time.About half of people with a sudden onset of back pain feel better in just 2 weeks. About 8 in 10 people feel better by 6 weeks.  CAUSES Some common causes of back pain include:  Strain of the muscles or ligaments supporting the spine.  Wear and tear (degeneration) of the spinal discs.  Arthritis.  Direct injury to the back. DIAGNOSIS Most of the time, the direct cause of low back pain is not known.However, back pain can be treated effectively even when the exact cause of the pain is unknown.Answering your caregiver's questions about your  overall health and symptoms is one of the most accurate ways to make sure the cause of your pain is not dangerous. If your caregiver needs more information, he or she may order lab work or imaging tests (X-rays or MRIs).However, even if imaging tests show changes in your back, this usually does not require surgery. HOME CARE INSTRUCTIONS For many people, back pain returns.Since low back pain is rarely dangerous, it is often a condition that people can learn to [facility]  their own.   Remain active. It is stressful on the back to sit or stand in one place. Do not sit, drive, or stand in one place for more than 30 minutes at a time. Take short walks on level surfaces as soon as pain allows.Try to increase the length of time you walk each day.  Do not stay in bed.Resting more than 1 or 2 days can delay your recovery.  Do not avoid exercise or work.Your body is made to move.It is not dangerous to be active, even though your back may hurt.Your back will likely heal faster if you return to being active before your pain is gone.  Pay attention to your body when you bend and lift. Many people have less discomfortwhen lifting if they bend their knees, keep the load close to their bodies,and avoid twisting. Often, the most comfortable positions are those that put less stress on your recovering back.  Find a comfortable position to sleep. Use a firm mattress and lie on your side with your knees slightly bent. If you lie on your back, put a pillow under your knees.  Only take over-the-counter or prescription medicines as directed by your caregiver. Over-the-counter medicines to reduce pain and inflammation are often the most helpful.Your caregiver may prescribe muscle relaxant drugs.These medicines help dull your pain so you can more quickly return to your normal activities and healthy exercise.  Put ice on the injured area.  Put ice in a plastic bag.  Place a towel between your skin and the bag.  Leave the ice on for 15-20 minutes, 03-04 times a day for the first 2 to 3 days. After that, ice and heat may be alternated to reduce pain and spasms.  Ask your caregiver about trying back exercises and gentle massage. This may be of some benefit.  Avoid feeling anxious or stressed.Stress increases muscle tension and can worsen back pain.It is important to recognize when you are anxious or stressed and learn ways to manage it.Exercise is a great option. SEEK  MEDICAL CARE IF:  You have pain that is not relieved with rest or medicine.  You have pain that does not improve in 1 week.  You have new symptoms.  You are generally not feeling well. SEEK IMMEDIATE MEDICAL CARE IF:   You have pain that radiates from your back into your legs.  You develop new bowel or bladder control problems.  You have unusual weakness or numbness in your arms or legs.  You develop nausea or vomiting.  You develop abdominal pain.  You feel faint. Document Released: 06/18/2005 Document Revised: 12/18/2011 Document Reviewed: 10/20/2013 [facility] Patient Information 2015 [place], [place]. This information is not intended to replace advice given to you by your health care provider. Make sure you discuss any questions you have with your health care provider.  Muscle Strain A muscle strain is an injury that occurs when a muscle is stretched beyond its normal length. Usually a small number of muscle fibers are torn when this happens.  Muscle strain is rated in degrees. First-degree strains have the least amount of muscle fiber tearing and pain. Second-degree and third-degree strains have increasingly more tearing and pain.  Usually, recovery from muscle strain takes 1-2 weeks. Complete healing takes 5-6 weeks.  CAUSES  Muscle strain happens when a sudden, violent force placed on a muscle stretches it too far. This may occur with lifting, sports, or a fall.  RISK FACTORS Muscle strain is especially common in athletes.  SIGNS AND SYMPTOMS At the site of the muscle strain, there may be:  Pain.  Bruising.  Swelling.  Difficulty using the muscle due to pain or lack of normal function. DIAGNOSIS  Your health care provider will perform a physical exam and ask about your medical history. TREATMENT  Often, the best treatment for a muscle strain is resting, icing, and applying cold compresses to the injured area.  HOME CARE INSTRUCTIONS   Use the PRICE method of  treatment to promote muscle healing during the first 2-3 days after your injury. The PRICE method involves:  Protecting the muscle from being injured again.  Restricting your activity and resting the injured body part.  Icing your injury. To do this, put ice in a plastic bag. Place a towel between your skin and the bag. Then, apply the ice and leave it on from 15-20 minutes each hour. After the third day, switch to moist heat packs.  Apply compression to the injured area with a splint or elastic bandage. Be careful not to wrap it too tightly. This may interfere with blood circulation or increase swelling.  Elevate the injured body part above the level of your heart as often as you can.  Only take over-the-counter or prescription medicines for pain, discomfort, or fever as directed by your health care provider.  Warming up prior to exercise helps to prevent future muscle strains. SEEK MEDICAL CARE IF:   You have increasing pain or swelling in the injured area.  You have numbness, tingling, or a significant loss of strength in the injured area. MAKE SURE YOU:   Understand these instructions.  Will watch your condition.  Will get help right away if you are not doing well or get worse. Document Released: 06/18/2005 Document Revised: 04/08/2013 Document Reviewed: 01/15/2013 [facility] Patient Information 2015 [place]. This information is not intended to replace advice given to you by your health care provider. Make sure you discuss any questions you have with your health care provider.  Thoracic Strain You have injured the muscles or tendons that attach to the upper part of your back behind your chest. This injury is called a thoracic strain, thoracic sprain, or mid-back strain.  CAUSES  The cause of thoracic strain varies. A less severe injury involves pulling a muscle or tendon without tearing it. A more severe injury involves tearing (rupturing) a muscle or tendon. With less  severe injuries, there may be little loss of strength. Sometimes, there are breaks (fractures) in the bones to which the muscles are attached. These fractures are rare, unless there was a direct hit (trauma) or you have weak bones due to osteoporosis or age. Longstanding strains may be caused by overuse or improper form during certain movements. Obesity can also increase your risk for back injuries. Sudden strains may occur due to injury or not warming up properly before exercise. Often, there is no obvious cause for a thoracic strain. SYMPTOMS  The main symptom is pain, especially with movement, such as during exercise. DIAGNOSIS  Your  caregiver can usually tell what is wrong by taking an X-ray and doing a physical exam. TREATMENT   Physical therapy may be helpful for recovery. Your caregiver can give you exercises to do or refer you to a physical therapist after your pain improves.  After your pain improves, strengthening and conditioning programs appropriate for your sport or occupation may be helpful.  Always warm up before physical activities or athletics. Stretching after physical activity may also help.  Certain over-the-counter medicines may also help. Ask your caregiver if there are medicines that would help you. If this is your first thoracic strain injury, proper care and proper healing time before starting activities should prevent long-term problems. Torn ligaments and tendons require as long to heal as broken bones. Average healing times may be only 1 week for a mild strain. For torn muscles and tendons, healing time may be up to 6 weeks to 2 months. HOME CARE INSTRUCTIONS   Apply ice to the injured area. Ice massages may also be used as directed.  Put ice in a plastic bag.  Place a towel between your skin and the bag.  Leave the ice on for 15-20 minutes, 03-04 times a day, for the first 2 days.  Only take over-the-counter or prescription medicines for pain, discomfort, or  fever as directed by your caregiver.  Keep your appointments for physical therapy if this was prescribed.  Use wraps and back braces as instructed. SEEK IMMEDIATE MEDICAL CARE IF:   You have an increase in bruising, swelling, or pain.  Your pain has not improved with medicines.  You develop new shortness of breath, chest pain, or fever.  Problems seem to be getting worse rather than better. MAKE SURE YOU:   Understand these instructions.  Will watch your condition.  Will get help right away if you are not doing well or get worse. Document Released: 09/08/2003 Document Revised: 09/10/2011 Document Reviewed: 08/04/2010 [facility] Patient Information 2015 [place], [place]. This information is not intended to replace advice given to you by your health care provider. Make sure you discuss any questions you have with your health care provider.

## 2014-08-18 NOTE — ED Notes (Signed)
C/o low back pain onset Monday after picking up 50 lb. Bag of salt he fell. He landed on his back. His arm went out. He hit R forearm on the storage building and it is painful. Pt. has 1" healing laceration to same.

## 2014-09-06 ENCOUNTER — Ambulatory Visit (INDEPENDENT_AMBULATORY_CARE_PROVIDER_SITE_OTHER): Payer: BLUE CROSS/BLUE SHIELD | Admitting: Family Medicine

## 2014-09-06 VITALS — BP 110/65 | HR 106 | Temp 98.1°F | Resp 16 | Ht 68.0 in | Wt 179.0 lb

## 2014-09-06 DIAGNOSIS — R51 Headache: Secondary | ICD-10-CM

## 2014-09-06 DIAGNOSIS — B349 Viral infection, unspecified: Secondary | ICD-10-CM | POA: Diagnosis not present

## 2014-09-06 DIAGNOSIS — R519 Headache, unspecified: Secondary | ICD-10-CM

## 2014-09-06 DIAGNOSIS — M791 Myalgia, unspecified site: Secondary | ICD-10-CM

## 2014-09-06 MED ORDER — ETODOLAC 300 MG PO CAPS: 300.0000 mg | ORAL_CAPSULE | Freq: Three times a day (TID) | ORAL | Status: DC

## 2014-09-06 NOTE — Progress Notes (Signed)
Subjective

## 2014-09-06 NOTE — Patient Instructions (Signed)
Get plenty of rest  Drink lots of fluids  Take etodolac 300 mg 3 times daily as needed for headache and body ache  Can take Tylenol(acetaminophen) 1000 mg (500 mg 2) 3 times daily in addition to the above if needed for the aching  Stay off work through tomorrow.  Return if getting worse or not improving.

## 2015-01-21 ENCOUNTER — Ambulatory Visit: Payer: BLUE CROSS/BLUE SHIELD

## 2015-03-03 ENCOUNTER — Ambulatory Visit: Payer: Self-pay | Admitting: General Surgery

## 2015-03-03 NOTE — H&P (Signed)
History of Present Illness Tyler Carl MD; 03/03/2015 2:46 PM) Patient words: Hernia.  The patient is a 59 year old male who presents with an incisional hernia. 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  The patient was referred by: Dr. Missy Sabins   Other Problems (Rowe Clack, RN, BSN; 03/03/2015 2:26 PM) Hypercholesterolemia Inguinal Hernia  Past Surgical History Rowe Clack, RN, BSN; 03/03/2015 2:47 PM) No pertinent past surgical history previous umbilical hernia repair  Allergies Rowe Clack, RN, BSN; 03/03/2015 2:27 PM) Seasonal IC *ASSORTED CLASSES*  Medication History Rowe Clack, RN, BSN; 03/03/2015 2:29 PM) Aspirin (325MG  Tablet, Oral) Active. Astelin (137MCG/SPRAY Solution, Nasal) Active. ZyrTEC Allergy (10MG  Capsule, Oral as needed) Active. Lodine (200MG  Capsule, Oral daily) Active. Lovastatin (40MG  Tablet, Oral daily) Active. Multiple Vitamin (Oral daily) Active. Medications Reconciled  Social History Personnel officer, RN, BSN; 03/03/2015 2:26 PM) Alcohol use Moderate alcohol use. Caffeine use Coffee. No drug use Tobacco use Current some day smoker.  Family History (Rowe Clack, RN, BSN; 03/03/2015 2:26 PM) Alcohol Abuse Father.  Review of Systems Tyler Carl MD; 03/03/2015 2:47 PM) General Not Present- Appetite Loss, Chills, Fatigue, Fever, Night Sweats, Weight Gain and Weight Loss. Skin Not Present- Change in Wart/Mole, Dryness, Hives, Jaundice, New Lesions, Non-Healing Wounds, Rash and Ulcer. HEENT Not Present- Earache, Hearing Loss,  Hoarseness, Nose Bleed, Oral Ulcers, Ringing in the Ears, Seasonal Allergies, Sinus Pain, Sore Throat, Visual Disturbances, Wears glasses/contact lenses and Yellow Eyes. Respiratory Not Present- Bloody sputum, Chronic Cough, Difficulty Breathing, Snoring and Wheezing. Breast Not Present- Breast Mass, Breast Pain, Nipple Discharge and Skin Changes. Cardiovascular Not Present- Chest Pain, Difficulty Breathing Lying Down, Leg Cramps, Palpitations, Rapid Heart Rate, Shortness of Breath and Swelling of Extremities. Gastrointestinal Present- Abdominal Pain. Not Present- Bloating, Bloody Stool, Change in Bowel Habits, Chronic diarrhea, Constipation, Difficulty Swallowing, Excessive gas, Gets full quickly at meals, Hemorrhoids, Indigestion, Nausea, Rectal Pain and Vomiting. Male Genitourinary Not Present- Blood in Urine, Change in Urinary Stream, Frequency, Impotence, Nocturia, Painful Urination, Urgency and Urine Leakage. Musculoskeletal Not Present- Back Pain, Joint Pain, Joint Stiffness, Muscle Pain, Muscle Weakness and Swelling of Extremities. Neurological Not Present- Decreased Memory, Fainting, Headaches, Numbness, Seizures, Tingling, Tremor, Trouble walking and Weakness. Psychiatric Not Present- Anxiety, Bipolar, Change in Sleep Pattern, Depression, Fearful and Frequent crying. Endocrine Not Present- Cold Intolerance, Excessive Hunger, Hair Changes, Heat Intolerance, Hot flashes and New Diabetes. Hematology Not Present- Easy Bruising, Excessive bleeding, Gland problems, HIV and Persistent Infections.   Vitals Glass blower/designer, BSN; 03/03/2015 2:26 PM) 03/03/2015 2:26 PM Weight: 178.2 lb Height: 68in Body Surface Area: 1.97 m Body Mass Index: 27.09 kg/m Temp.: 98.4F(Oral)  Pulse: 84 (Regular)  BP: 130/80 (Sitting, Left Arm, Standard)    Physical Exam Tyler Carl MD; 03/03/2015 2:48 PM) General Mental Status-Alert. General Appearance-Consistent with stated  age. Hydration-Well hydrated. Voice-Normal.  Head and Neck Head-normocephalic, atraumatic with no lesions or palpable masses. Trachea-midline. Thyroid Gland Characteristics - normal size and consistency.  Eye Eyeball - Bilateral-Extraocular movements intact. Sclera/Conjunctiva - Bilateral-No scleral icterus.  Chest and Lung Exam Chest and lung exam reveals -quiet, even and easy respiratory effort with no use of accessory muscles and on auscultation, normal  breath sounds, no adventitious sounds and normal vocal resonance. Inspection Chest Wall - Normal. Back - normal.  Breast - Did not examine.  Cardiovascular Cardiovascular examination reveals -normal heart sounds, regular rate and rhythm with no murmurs and normal pedal pulses bilaterally.  Abdomen Inspection Skin - Scar - no surgical scars. Hernias - Incisional - Reducible. Note: large bulge in supraumbilical bulge, small bulge at umbilical site. Palpation/Percussion Normal exam - Soft, Non Tender, No Rebound tenderness, No Rigidity (guarding) and No hepatosplenomegaly. Auscultation Normal exam - Bowel sounds normal.  Neurologic Neurologic evaluation reveals -alert and oriented x 3 with no impairment of recent or remote memory. Mental Status-Normal.  Musculoskeletal Normal Exam - Left-Upper Extremity Strength Normal and Lower Extremity Strength Normal. Normal Exam - Right-Upper Extremity Strength Normal and Lower Extremity Strength Normal.  Lymphatic Head & Neck General Head & Neck Lymphatics: Bilateral - Description - Normal. Axillary General Axillary Region: Bilateral - Description - Normal. Tenderness - Non Tender. Femoral & Inguinal Generalized Femoral & Inguinal Lymphatics: Bilateral - Description - Normal. Tenderness - Non Tender.    Assessment & Plan Tyler Carl MD; 03/03/2015 2:52 PM) INCISIONAL HERNIA, WITHOUT OBSTRUCTION OR GANGRENE (553.21  K43.2) Story: supraumbilical and  periumbilical (incisional) hernias Impression: He has 2 weeks of abdominal pain, no obstructive type symptoms. I stated he is at no risk of worsening if he continues to lift before surgery, but that over time it may enlarge. We discussed the risk of obstruction, strangulation, incarceration. He decided to proceed with repair. Because of the 2 sites of hernia I recommended laparoscopic repair with mesh to best assess defects and minimal scarring. -He also is due for colonoscopy (last one 5 years ago with intended follow up at 2 years) and I have recommended he complete this before the end of the year but would still complete the hernia repair now. Current Plans  You are being scheduled for surgery - Our schedulers will call you.  You should hear from our office's scheduling department within 5 working days about the location, date, and time of surgery. We try to make accommodations for patient's preferences in scheduling surgery, but sometimes the OR schedule or the surgeon's schedule prevents [place] from making those accommodations.  If you have not heard from our office [phone]) in 5 working days, call the office and ask for your surgeon's nurse.  If you have other questions about your diagnosis, plan, or surgery, call the office and ask for your surgeon's nurse. Pt Education - Pamphlet Given - Laparoscopic Hernia Repair: discussed with patient and provided information.   The anatomy & physiology of the abdominal wall was discussed. The pathophysiology of hernias was discussed. Natural history risks without surgery including progeressive enlargement, pain, incarceration, & strangulation was discussed. Contributors to complications such as smoking, obesity, diabetes, prior surgery, etc were discussed.  I feel the risks of no intervention will lead to serious problems that outweigh the operative risks; therefore, I recommended surgery to reduce and repair the hernia. I explained laparoscopic  techniques with possible need for an open approach. I noted the probable use of mesh to patch and/or buttress the hernia repair  Risks such as bleeding, infection, abscess, need for further treatment, heart attack, death, and other risks were discussed. I noted a good likelihood this will help address the problem. Goals of post-operative recovery were discussed as well. Possibility that this will not correct all symptoms was explained. I stressed the importance of low-impact activity, aggressive pain control, avoiding constipation, &  not pushing through pain to minimize risk of post-operative chronic pain or injury. Possibility of reherniation especially with smoking, obesity, diabetes, immunosuppression, and other health conditions was discussed. We will work to minimize complications.  An educational handout further explaining the pathology & treatment options was given as well. Questions were answered. The patient expresses understanding & wishes to proceed with surgery.

## 2015-09-12 ENCOUNTER — Emergency Department (HOSPITAL_COMMUNITY)
Admission: EM | Admit: 2015-09-12 | Discharge: 2015-09-12 | Disposition: A | Discharge status: Home / Self Care | Payer: BLUE CROSS/BLUE SHIELD | Attending: Physician Assistant | Admitting: Physician Assistant

## 2015-09-12 ENCOUNTER — Encounter (HOSPITAL_COMMUNITY): Payer: Self-pay | Admitting: Emergency Medicine

## 2015-09-12 DIAGNOSIS — Z79899 Other long term (current) drug therapy: Secondary | ICD-10-CM | POA: Insufficient documentation

## 2015-09-12 DIAGNOSIS — Z87891 Personal history of nicotine dependence: Secondary | ICD-10-CM | POA: Insufficient documentation

## 2015-09-12 DIAGNOSIS — Z8719 Personal history of other diseases of the digestive system: Secondary | ICD-10-CM | POA: Insufficient documentation

## 2015-09-12 DIAGNOSIS — L309 Dermatitis, unspecified: Secondary | ICD-10-CM | POA: Insufficient documentation

## 2015-09-12 DIAGNOSIS — L853 Xerosis cutis: Secondary | ICD-10-CM

## 2015-09-12 DIAGNOSIS — M1990 Unspecified osteoarthritis, unspecified site: Secondary | ICD-10-CM | POA: Insufficient documentation

## 2015-09-12 DIAGNOSIS — E785 Hyperlipidemia, unspecified: Secondary | ICD-10-CM | POA: Insufficient documentation

## 2015-09-12 DIAGNOSIS — Z7982 Long term (current) use of aspirin: Secondary | ICD-10-CM | POA: Insufficient documentation

## 2015-09-12 DIAGNOSIS — Z791 Long term (current) use of non-steroidal anti-inflammatories (NSAID): Secondary | ICD-10-CM | POA: Insufficient documentation

## 2015-09-12 NOTE — Discharge Instructions (Signed)
1. Continue home medications. 2. Start taking Benadryl as needed for itching.  Use an unscented moisturizing lotion all over your body.  Take warm showers. 3.  Follow up with your PCP in 2-3 days for re-evaluation.     Contact Dermatitis   Dermatitis is redness, soreness, and swelling (inflammation) of the skin. Contact dermatitis is a reaction to certain substances that touch the skin. There are two types of contact dermatitis:  Irritant contact dermatitis. This type is caused by something that irritates your skin, such as dry hands from washing them too much. This type does not require previous exposure to the substance for a reaction to occur. This type is more common.  Allergic contact dermatitis. This type is caused by a substance that you are allergic to, such as a nickel allergy or poison ivy. This type only occurs if you have been exposed to the substance (allergen) before. Upon a repeat exposure, your body reacts to the substance. This type is less common. CAUSES  Many different substances can cause contact dermatitis. Irritant contact dermatitis is most commonly caused by exposure to:  Makeup.  Soaps.  Detergents.  Bleaches.  Acids.  Metal salts, such as nickel.  Allergic contact dermatitis is most commonly caused by exposure to:  Poisonous plants.  Chemicals.  Jewelry.  Latex.  Medicines.  Preservatives in products, such as clothing.  RISK FACTORS  This condition is more likely to develop in:  People who have jobs that expose them to irritants or allergens.  People who have certain medical conditions, such as asthma or eczema.  SYMPTOMS  Symptoms of this condition may occur anywhere on your body where the irritant has touched you or is touched by you. Symptoms include:  Dryness or flaking.  Redness.  Cracks.  Itching.  Pain or a burning feeling.  Blisters.  Drainage of small amounts of blood or clear fluid from skin cracks. With allergic contact dermatitis, there may  also be swelling in areas such as the eyelids, mouth, or genitals.  DIAGNOSIS  This condition is diagnosed with a medical history and physical exam. A patch skin test may be performed to help determine the cause. If the condition is related to your job, you may need to see an occupational medicine specialist.  TREATMENT  Treatment for this condition includes figuring out what caused the reaction and protecting your skin from further contact. Treatment may also include:  Steroid creams or ointments. Oral steroid medicines may be needed in more severe cases.  Antibiotics or antibacterial ointments, if a skin infection is present.  Antihistamine lotion or an antihistamine taken by mouth to ease itching.  A bandage (dressing). HOME CARE INSTRUCTIONS  Skin Care  Moisturize your skin as needed.  Apply cool compresses to the affected areas.  Try taking a bath with:  Epsom salts. Follow the instructions on the packaging. You can get these at your local pharmacy or grocery store.  Baking soda. Pour a small amount into the bath as directed by your health care provider.  Colloidal oatmeal. Follow the instructions on the packaging. You can get this at your local pharmacy or grocery store. Try applying baking soda paste to your skin. Stir water into baking soda until it reaches a paste-like consistency.  Do not scratch your skin.  Bathe less frequently, such as every other day.  Bathe in lukewarm water. Avoid using hot water. Medicines  Take or apply over-the-counter and prescription medicines only as told by your health care provider.  If you were prescribed an antibiotic medicine, take or apply your antibiotic as told by your health care provider. Do not stop using the antibiotic even if your condition starts to improve. General Instructions  Keep all follow-up visits as told by your health care provider. This is important.  Avoid the substance that caused your reaction. If you do not know what caused  it, keep a journal to try to track what caused it. Write down:  What you eat.  What cosmetic products you use.  What you drink.  What you wear in the affected area. This includes jewelry. If you were given a dressing, take care of it as told by your health care provider. This includes when to change and remove it. SEEK MEDICAL CARE IF:  Your condition does not improve with treatment.  Your condition gets worse.  You have signs of infection such as swelling, tenderness, redness, soreness, or warmth in the affected area.  You have a fever.  You have new symptoms. SEEK IMMEDIATE MEDICAL CARE IF:  You have a severe headache, neck pain, or neck stiffness.  You vomit.  You feel very sleepy.  You notice red streaks coming from the affected area.  Your bone or joint underneath the affected area becomes painful after the skin has healed.  The affected area turns darker.  You have difficulty breathing. This information is not intended to replace advice given to you by your health care provider. Make sure you discuss any questions you have with your health care provider.  Document Released: 06/15/2000 Document Revised: 03/09/2015 Document Reviewed: 11/03/2014  Elsevier Interactive Patient Education [employer]2016 Elsevier Inc.

## 2015-09-12 NOTE — ED Provider Notes (Signed)
CSN: [id number]     Arrival date & time 09/12/15  1244 History  By signing my name below, I, Soijett Blue, attest that this documentation has been prepared under the direction and in the presence of Cheri FowlerKayla Lynell Kussman, PA-C Electronically Signed: Soijett Blue, ED Scribe. 09/12/2015. 3:04 PM.     Chief Complaint  Patient presents with  . Pruritis      The history is provided by the patient. No language interpreter was used.    HPI Comments: Tyler Perry is a 60 y.o. male who presents to the Emergency Department complaining of generalized pruritis onset 1 day. Pt states that he was itching prior to getting into the shower. He notes that his symptoms after getting out of a shower. Pt had initially had itching to his bilateral legs, back, lower abdomen, and left arm and the areas were red and itchy and they stopped on their own without medications. Pt denies itching at this time. Denies any new medication change or environment. He states that he has not tried any medications for the relief for his symptoms. He denies fever, chills, and any other symptoms. Denies PMHx of asthma.   Past Medical History  Diagnosis Date  . Hyperlipidemia   . Gout, unspecified   . Allergy   . Osteoarthrosis, unspecified whether generalized or localized, unspecified site   . Hernia   . Insulin resistance    Past Surgical History  Procedure Laterality Date  . Hernia repair  2003    umb hernia   Family History  Problem Relation Age of Onset  . Cancer Mother     bone  . Heart disease Father   . Cancer Maternal Aunt     unaware   Social History  Substance Use Topics  . Smoking status: Former Smoker    Quit date: 07/03/2003  . Smokeless tobacco: Never Used  . Alcohol Use: No    Review of Systems  Constitutional: Negative for fever and chills.  Skin: Negative for color change, rash and wound.      Allergies  Review of patient's allergies indicates no known allergies.  Home Medications   Prior  to Admission medications   Medication Sig Start Date End Date Taking? Authorizing Provider  aspirin 81 MG tablet Take 81 mg by mouth daily.      Historical Provider, MD  azelastine (ASTELIN) 137 MCG/SPRAY nasal spray BID times 48H. 08/05/11   Historical Provider, MD  cetirizine (ZYRTEC) 10 MG tablet Take 10 mg by mouth daily.    Historical Provider, MD  etodolac (LODINE) 300 MG capsule Take 1 capsule (300 mg total) by mouth 3 (three) times daily. 09/06/14   Peyton Najjaravid H Hopper, MD  HYDROcodone-homatropine [facility](HYCODAN) 5-1.5 MG/5ML syrup Take 5 mLs by mouth every 8 (eight) hours as needed for cough. Patient not taking: Reported on 09/06/2014 06/10/13   Porfirio Oarhelle Jeffery, PA-C  lovastatin (MEVACOR) 40 MG tablet Take 20 mg by mouth daily.     Historical Provider, MD  Multiple Vitamin (MULTIVITAMIN) tablet Take 1 tablet by mouth daily.      Historical Provider, MD   BP 130/81 mmHg  Pulse 77  Temp(Src) 98.3 F (36.8 C) (Oral)  Resp 18  SpO2 100% Physical Exam  Constitutional: He is oriented to person, place, and time. He appears well-developed and well-nourished.  HENT:  Head: Normocephalic and atraumatic.  Right Ear: External ear normal.  Left Ear: External ear normal.  Eyes: Conjunctivae are normal. No scleral icterus.  Neck: No tracheal  deviation present.  Pulmonary/Chest: Effort normal. No respiratory distress.  Abdominal: He exhibits no distension.  Musculoskeletal: Normal range of motion.  Neurological: He is alert and oriented to person, place, and time.  Skin: Skin is warm and dry. No erythema.  Generalized dry skin without erythema or signs of infection.   Psychiatric: He has a normal mood and affect. His behavior is normal.    ED Course  Procedures (including critical care time) DIAGNOSTIC STUDIES: Oxygen Saturation is 100% on RA, nl by my interpretation.    COORDINATION OF CARE: 2:59 PM Discussed treatment plan with pt at bedside which includes benadryl and pt agreed to plan.    Labs  Review Labs Reviewed - No data to display  Imaging Review No results found.    EKG Interpretation None      MDM   Final diagnoses:  Dry skin dermatitis    Likely dermatitis secondary to dry skin.  VSS, NAD.  No erythema, lichenification, or signs of infection.  Worse after hot showers.  No systemic symptoms.  No medication changes.  Doubt anaphylaxis or drug eruption.  Patient advised to take warm showers and use unscented moisturizing lotions immediately after showers and at bedtime.  Recommend Benadryl for itching.  He has no symptoms at this time.  Discussed return precautions including signs of infection.  Follow up PCP.  Patient agrees and acknowledges the above plan for discharge.   I personally performed the services described in this documentation, which was scribed in my presence. The recorded information has been reviewed and is accurate.    Cheri Fowler, PA-C 09/12/15 1936  Courteney Randall An, MD 09/14/15 [phone]

## 2015-09-12 NOTE — ED Notes (Signed)
Patient presents for generalized body itching x1 day. Reports using Dove for first time two days ago and then itching started the next day. Reports relief of symptoms after showering today, using normal body wash. No rash or swelling noted currently. Denies new medications or foods.

## 2016-04-24 ENCOUNTER — Ambulatory Visit: Payer: BLUE CROSS/BLUE SHIELD | Admitting: Family Medicine

## 2016-05-11 ENCOUNTER — Encounter: Payer: Self-pay | Admitting: Family Medicine

## 2016-05-11 ENCOUNTER — Ambulatory Visit: Payer: Self-pay | Attending: Family Medicine | Admitting: Family Medicine

## 2016-05-11 VITALS — BP 140/80 | HR 78 | Temp 98.5°F | Resp 16 | Wt 191.0 lb

## 2016-05-11 DIAGNOSIS — M19011 Primary osteoarthritis, right shoulder: Secondary | ICD-10-CM

## 2016-05-11 DIAGNOSIS — M19019 Primary osteoarthritis, unspecified shoulder: Secondary | ICD-10-CM | POA: Insufficient documentation

## 2016-05-11 DIAGNOSIS — M19219 Secondary osteoarthritis, unspecified shoulder: Secondary | ICD-10-CM | POA: Insufficient documentation

## 2016-05-11 DIAGNOSIS — M109 Gout, unspecified: Secondary | ICD-10-CM | POA: Insufficient documentation

## 2016-05-11 DIAGNOSIS — E7800 Pure hypercholesterolemia, unspecified: Secondary | ICD-10-CM

## 2016-05-11 DIAGNOSIS — E785 Hyperlipidemia, unspecified: Secondary | ICD-10-CM | POA: Insufficient documentation

## 2016-05-11 DIAGNOSIS — M19012 Primary osteoarthritis, left shoulder: Secondary | ICD-10-CM

## 2016-05-11 DIAGNOSIS — J302 Other seasonal allergic rhinitis: Secondary | ICD-10-CM | POA: Insufficient documentation

## 2016-05-11 DIAGNOSIS — Z23 Encounter for immunization: Secondary | ICD-10-CM

## 2016-05-11 DIAGNOSIS — Z7982 Long term (current) use of aspirin: Secondary | ICD-10-CM | POA: Insufficient documentation

## 2016-05-11 LAB — LIPID PANEL
Cholesterol: 267 mg/dL — ABNORMAL HIGH (ref <200)
HDL: 61 mg/dL (ref >40)
LDL Cholesterol: 194 mg/dL — ABNORMAL HIGH (ref <100)
Total CHOL/HDL Ratio: 4.4 Ratio (ref <5.0)
Triglycerides: 59 mg/dL (ref <150)
VLDL: 12 mg/dL (ref <30)

## 2016-05-11 LAB — COMPLETE METABOLIC PANEL WITH GFR
ALT: 23 U/L (ref 9–46)
AST: 21 U/L (ref 10–35)
Albumin: 4.5 g/dL (ref 3.6–5.1)
Alkaline Phosphatase: 98 U/L (ref 40–115)
BUN: 12 mg/dL (ref 7–25)
CO2: 27 mmol/L (ref 20–31)
Calcium: 9.6 mg/dL (ref 8.6–10.3)
Chloride: 100 mmol/L (ref 98–110)
Creat: 0.91 mg/dL (ref 0.70–1.33)
GFR, Est African American: >89 mL/min (ref >=60)
GFR, Est Non African American: >89 mL/min (ref >=60)
Glucose, Bld: 132 mg/dL — ABNORMAL HIGH (ref 65–99)
Potassium: 4.9 mmol/L (ref 3.5–5.3)
Sodium: 137 mmol/L (ref 135–146)
Total Bilirubin: 0.6 mg/dL (ref 0.2–1.2)
Total Protein: 7.7 g/dL (ref 6.1–8.1)

## 2016-05-11 MED ORDER — IBUPROFEN 600 MG PO TABS
600.0000 mg | ORAL_TABLET | Freq: Three times a day (TID) | ORAL | 0 refills | Status: DC | PRN
Start: 2016-05-11 — End: 2016-06-19

## 2016-05-11 MED ORDER — LOVASTATIN 20 MG PO TABS: 20.0000 mg | ORAL_TABLET | Freq: Every day | ORAL | 3 refills | Status: DC

## 2016-05-11 MED ORDER — CETIRIZINE HCL 10 MG PO TABS: 10.0000 mg | ORAL_TABLET | Freq: Every day | ORAL | 2 refills | Status: DC

## 2016-05-11 MED FILL — ?IBUPROFEN 600 MG TABLET: 600 MG | 10 days supply | Qty: 30 | Fill #0

## 2016-05-11 MED FILL — LOVASTATIN 20 MG TABLET: 20 | 30 days supply | Qty: 30 | Fill #0

## 2016-05-11 MED FILL — ?CETIRIZINE HCL 10 MG TABLE: 10 | 30 days supply | Qty: 30 | Fill #0

## 2016-05-11 NOTE — Progress Notes (Signed)
Subjective:  Patient ID: Tyler Perry, male    DOB: 1955/07/10  Age: 60 y.o. MRN: [id number]  CC: New Patient (Initial Visit)   HPI Tyler Perry is a 60 year old male with a history of osteoarthritis of the shoulder, hypercholesterolemia previously followed by [facility] who comes into the clinic to get established. He has been out of his lovastatin and is reading need refills  Takes Zyrtec for chronic seasonal allergies and ibuprofen for osteoarthritis in both shoulders which helps relieve symptoms.  He has had cortisone injections in the past in his shoulders. Willing to receive the flu shot today.  Past Medical History:  Diagnosis Date  . Allergy   . Gout, unspecified   . Hernia   . Hyperlipidemia   . Insulin resistance   . Osteoarthrosis, unspecified whether generalized or localized, unspecified site     Past Surgical History:  Procedure Laterality Date  . HERNIA REPAIR  2003   umb hernia      Outpatient Medications Prior to Visit  Medication Sig Dispense Refill  . aspirin 81 MG tablet Take 81 mg by mouth daily.      Marland Kitchen. azelastine (ASTELIN) 137 MCG/SPRAY nasal spray BID times 48H.    . etodolac (LODINE) 300 MG capsule Take 1 capsule (300 mg total) by mouth 3 (three) times daily. (Patient not taking: Reported on 05/11/2016) 20 capsule 0  . Multiple Vitamin (MULTIVITAMIN) tablet Take 1 tablet by mouth daily.      . cetirizine (ZYRTEC) 10 MG tablet Take 10 mg by mouth daily.    Marland Kitchen. HYDROcodone-homatropine (HYCODAN) 5-1.5 MG/5ML syrup Take 5 mLs by mouth every 8 (eight) hours as needed for cough. (Patient not taking: Reported on 05/11/2016) 120 mL 0  . lovastatin (MEVACOR) 40 MG tablet Take 20 mg by mouth daily.      No facility-administered medications prior to visit.     ROS Review of Systems  Constitutional: Negative for activity change and appetite change.  HENT: Positive for congestion and postnasal drip. Negative for sinus pressure  and sore throat.   Respiratory: Negative for chest tightness, shortness of breath and wheezing.   Cardiovascular: Negative for chest pain and palpitations.  Gastrointestinal: Negative for abdominal distention, abdominal pain and constipation.  Genitourinary: Negative.   Musculoskeletal:       See hpi  Psychiatric/Behavioral: Negative for behavioral problems and dysphoric mood.    Objective:  BP 140/80 (BP Location: Right Arm, Patient Position: Sitting, Cuff Size: Large)   Pulse 78   Temp 98.5 F (36.9 C) (Oral)   Resp 16   Wt 191 lb (86.6 kg)   SpO2 97%   BMI 29.04 kg/m   BP/Weight 05/11/2016 09/12/2015 09/06/2014  Systolic BP 140 130 110  Diastolic BP 80 81 65  Wt. (Lbs) 191 - 179  BMI 29.04 - 27.22      Physical Exam  Constitutional: He is oriented to person, place, and time. He appears well-developed and well-nourished.  HENT:  Right Ear: External ear normal.  Left Ear: External ear normal.  Mouth/Throat: Oropharynx is clear and moist.  Cardiovascular: Normal rate, normal heart sounds and intact distal pulses.   No murmur heard. Pulmonary/Chest: Effort normal and breath sounds normal. He has no wheezes. He has no rales. He exhibits no tenderness.  Abdominal: Soft. Bowel sounds are normal. He exhibits no distension and no mass. There is no tenderness.  Musculoskeletal: Normal range of motion. He exhibits tenderness (Mild tenderness on range  of motion of both shoulders).  Neurological: He is alert and oriented to person, place, and time.     Assessment & Plan:   1. Pure hypercholesterolemia We'll make no changes to her regimen if lipids are elevated as he has been out of his statin Low-cholesterol diet, lifestyle modifications - lovastatin (MEVACOR) 20 MG tablet; Take 1 tablet (20 mg total) by mouth at bedtime.  Dispense: 90 tablet; Refill: 3 - cetirizine (ZYRTEC) 10 MG tablet; Take 1 tablet (10 mg total) by mouth daily.  Dispense: 30 tablet; Refill: 2 - ibuprofen  (ADVIL,MOTRIN) 600 MG tablet; Take 1 tablet (600 mg total) by mouth every 8 (eight) hours as needed.  Dispense: 30 tablet; Refill: 0 - COMPLETE METABOLIC PANEL WITH GFR - Lipid panel  2. Primary osteoarthritis of both shoulders Controlled on NSAIDs Prescribed ibuprofen  3. Seasonal allergies Continue Zyrtec   Meds ordered this encounter  Medications  . lovastatin (MEVACOR) 20 MG tablet    Sig: Take 1 tablet (20 mg total) by mouth at bedtime.    Dispense:  90 tablet    Refill:  3  . cetirizine (ZYRTEC) 10 MG tablet    Sig: Take 1 tablet (10 mg total) by mouth daily.    Dispense:  30 tablet    Refill:  2  . ibuprofen (ADVIL,MOTRIN) 600 MG tablet    Sig: Take 1 tablet (600 mg total) by mouth every 8 (eight) hours as needed.    Dispense:  30 tablet    Refill:  0    Follow-up: Return in about 1 month (around 06/10/2016) for follow up on elevated blood pressure.   Jaclyn ShaggyEnobong Amao MD

## 2016-05-11 NOTE — Progress Notes (Signed)
Pt is in the office today for a new patient visit Pt states his pain level today in the office is a 5 Pt states his pain is coming from his right shoulder Pt states he has been taking [employer] Tirealieve

## 2016-05-11 NOTE — Patient Instructions (Signed)

## 2016-05-14 ENCOUNTER — Telehealth: Payer: Self-pay

## 2016-05-14 NOTE — Telephone Encounter (Signed)
Writer called patient per Dr. Amao and discussed lab results.  Patient stated understanding. 

## 2016-05-14 NOTE — Telephone Encounter (Signed)
-----   Message from Jaclyn ShaggyEnobong Amao, MD sent at 05/14/2016  9:07 AM EST ----- Lipids are elevated, no regimen changes as he was out of his medications. Glucose is elevated and he will need to be screened for diabetes mellitus at his next visit.

## 2016-06-18 MED FILL — LOVASTATIN 20 MG TABLET: 20 | 30 days supply | Qty: 30 | Fill #1

## 2016-06-19 ENCOUNTER — Other Ambulatory Visit: Payer: Self-pay | Admitting: Family Medicine

## 2016-06-19 DIAGNOSIS — E7800 Pure hypercholesterolemia, unspecified: Secondary | ICD-10-CM

## 2016-06-19 MED FILL — ?IBUPROFEN 600 MG TABLET: 600 MG | 10 days supply | Qty: 30 | Fill #0

## 2016-06-19 MED FILL — ?CETIRIZINE HCL 10 MG TABLE: 10 | 30 days supply | Qty: 30 | Fill #1

## 2016-06-29 ENCOUNTER — Ambulatory Visit: Payer: Self-pay | Admitting: Family Medicine

## 2016-07-13 ENCOUNTER — Encounter: Payer: Self-pay | Admitting: Family Medicine

## 2016-07-13 ENCOUNTER — Ambulatory Visit: Payer: Self-pay | Attending: Family Medicine | Admitting: Family Medicine

## 2016-07-13 DIAGNOSIS — I10 Essential (primary) hypertension: Secondary | ICD-10-CM

## 2016-07-13 DIAGNOSIS — E785 Hyperlipidemia, unspecified: Secondary | ICD-10-CM | POA: Insufficient documentation

## 2016-07-13 DIAGNOSIS — Z7982 Long term (current) use of aspirin: Secondary | ICD-10-CM | POA: Insufficient documentation

## 2016-07-13 DIAGNOSIS — M1990 Unspecified osteoarthritis, unspecified site: Secondary | ICD-10-CM | POA: Insufficient documentation

## 2016-07-13 DIAGNOSIS — M109 Gout, unspecified: Secondary | ICD-10-CM | POA: Insufficient documentation

## 2016-07-13 MED ORDER — AMLODIPINE BESYLATE 10 MG PO TABS: 10.0000 mg | ORAL_TABLET | Freq: Every day | ORAL | 5 refills | Status: DC

## 2016-07-13 MED FILL — AMLODIPINE BESYLATE 10 MG T: 10 | 30 days supply | Qty: 30 | Fill #0

## 2016-07-13 NOTE — Patient Instructions (Signed)
Hypertension Hypertension, commonly called high blood pressure, is when the force of blood pumping through your arteries is too strong. Your arteries are the blood vessels that carry blood from your heart throughout your body. A blood pressure reading consists of a higher number over a lower number, such as 110/72. The higher number (systolic) is the pressure inside your arteries when your heart pumps. The lower number (diastolic) is the pressure inside your arteries when your heart relaxes. Ideally you want your blood pressure below 120/80. Hypertension forces your heart to work harder to pump blood. Your arteries may become narrow or stiff. Having untreated or uncontrolled hypertension can cause heart attack, stroke, kidney disease, and other problems. What increases the risk? Some risk factors for high blood pressure are controllable. Others are not. Risk factors you cannot control include:  Race. You may be at higher risk if you are African American.  Age. Risk increases with age.  Gender. Men are at higher risk than women before age 45 years. After age 65, women are at higher risk than men. Risk factors you can control include:  Not getting enough exercise or physical activity.  Being overweight.  Getting too much fat, sugar, calories, or salt in your diet.  Drinking too much alcohol. What are the signs or symptoms? Hypertension does not usually cause signs or symptoms. Extremely high blood pressure (hypertensive crisis) may cause headache, anxiety, shortness of breath, and nosebleed. How is this diagnosed? To check if you have hypertension, your health care provider will measure your blood pressure while you are seated, with your arm held at the level of your heart. It should be measured at least twice using the same arm. Certain conditions can cause a difference in blood pressure between your right and left arms. A blood pressure reading that is higher than normal on one occasion does  not mean that you need treatment. If it is not clear whether you have high blood pressure, you may be asked to return on a different day to have your blood pressure checked again. Or, you may be asked to monitor your blood pressure at home for 1 or more weeks. How is this treated? Treating high blood pressure includes making lifestyle changes and possibly taking medicine. Living a healthy lifestyle can help lower high blood pressure. You may need to change some of your habits. Lifestyle changes may include:  Following the DASH diet. This diet is high in fruits, vegetables, and whole grains. It is low in salt, red meat, and added sugars.  Keep your sodium intake below 2,300 mg per day.  Getting at least 30-45 minutes of aerobic exercise at least 4 times per week.  Losing weight if necessary.  Not smoking.  Limiting alcoholic beverages.  Learning ways to reduce stress. Your health care provider may prescribe medicine if lifestyle changes are not enough to get your blood pressure under control, and if one of the following is true:  You are 18-59 years of age and your systolic blood pressure is above 140.  You are 60 years of age or older, and your systolic blood pressure is above 150.  Your diastolic blood pressure is above 90.  You have diabetes, and your systolic blood pressure is over 140 or your diastolic blood pressure is over 90.  You have kidney disease and your blood pressure is above 140/90.  You have heart disease and your blood pressure is above 140/90. Your personal target blood pressure may vary depending on your medical   conditions, your age, and other factors. Follow these instructions at home:  Have your blood pressure rechecked as directed by your health care provider.  Take medicines only as directed by your health care provider. Follow the directions carefully. Blood pressure medicines must be taken as prescribed. The medicine does not work as well when you skip  doses. Skipping doses also puts you at risk for problems.  Do not smoke.  Monitor your blood pressure at home as directed by your health care provider. Contact a health care provider if:  You think you are having a reaction to medicines taken.  You have recurrent headaches or feel dizzy.  You have swelling in your ankles.  You have trouble with your vision. Get help right away if:  You develop a severe headache or confusion.  You have unusual weakness, numbness, or feel faint.  You have severe chest or abdominal pain.  You vomit repeatedly.  You have trouble breathing. This information is not intended to replace advice given to you by your health care provider. Make sure you discuss any questions you have with your health care provider. Document Released: 06/18/2005 Document Revised: 11/24/2015 Document Reviewed: 04/10/2013 Elsevier Interactive Patient Education  2017 [employer]  

## 2016-07-13 NOTE — Progress Notes (Signed)
Subjective:  Patient ID: Tyler Perry, male    DOB: 28-Jun-1956  Age: 61 y.o. MRN: [id number]  CC: Hypertension and Hyperlipidemia   HPI Tyler Perry is a 61 year old male with a history of hyperlipidemia who presents for Follow-up on his blood pressure as his blood pressure was elevated at his last office visit with no prior history of hypertension; it is elevated today and he does not take medications for hypertension. He endorses eating foods high in sodium and does not exercise regularly.  Past Medical History:  Diagnosis Date  . Allergy   . Gout, unspecified   . Hernia   . Hyperlipidemia   . Insulin resistance   . Osteoarthrosis, unspecified whether generalized or localized, unspecified site     Past Surgical History:  Procedure Laterality Date  . HERNIA REPAIR  2003   umb hernia    No Known Allergies   Outpatient Medications Prior to Visit  Medication Sig Dispense Refill  . aspirin 81 MG tablet Take 81 mg by mouth daily.      . cetirizine (ZYRTEC) 10 MG tablet Take 1 tablet (10 mg total) by mouth daily. 30 tablet 2  . ibuprofen (ADVIL,MOTRIN) 600 MG tablet TAKE 1 TABLET BY MOUTH EVERY 8 HOURS AS NEEDED. 30 tablet 0  . lovastatin (MEVACOR) 20 MG tablet Take 1 tablet (20 mg total) by mouth at bedtime. 90 tablet 3  . Multiple Vitamin (MULTIVITAMIN) tablet Take 1 tablet by mouth daily.      Marland Kitchen azelastine (ASTELIN) 137 MCG/SPRAY nasal spray BID times 48H.    . etodolac (LODINE) 300 MG capsule Take 1 capsule (300 mg total) by mouth 3 (three) times daily. (Patient not taking: Reported on 05/11/2016) 20 capsule 0   No facility-administered medications prior to visit.     ROS Review of Systems  Constitutional: Negative for activity change and appetite change.  HENT: Negative for sinus pressure and sore throat.   Respiratory: Negative for chest tightness, shortness of breath and wheezing.   Cardiovascular: Negative for chest pain and palpitations.    Gastrointestinal: Negative for abdominal distention, abdominal pain and constipation.  Genitourinary: Negative.   Musculoskeletal: Negative.   Psychiatric/Behavioral: Negative for behavioral problems and dysphoric mood.    Objective:  BP (!) 164/87 (BP Location: Right Arm, Patient Position: Sitting, Cuff Size: Small)   Pulse 71   Temp 98.5 F (36.9 C) (Oral)   Ht 5\' 8"  (1.727 m)   Wt 186 lb 9.6 oz (84.6 kg)   SpO2 98%   BMI 28.37 kg/m   BP/Weight 07/13/2016 05/11/2016 09/12/2015  Systolic BP 164 140 130  Diastolic BP 87 80 81  Wt. (Lbs) 186.6 191 -  BMI 28.37 29.04 -      Physical Exam  Constitutional: He is oriented to person, place, and time. He appears well-developed and well-nourished.  Cardiovascular: Normal rate, normal heart sounds and intact distal pulses.   No murmur heard. Pulmonary/Chest: Effort normal and breath sounds normal. He has no wheezes. He has no rales. He exhibits no tenderness.  Abdominal: Soft. Bowel sounds are normal. He exhibits no distension and no mass. There is no tenderness.  Musculoskeletal: Normal range of motion.  Neurological: He is alert and oriented to person, place, and time.     Assessment & Plan:   1. Essential hypertension Newly diagnosed Low-sodium diet - amLODipine (NORVASC) 10 MG tablet; Take 1 tablet (10 mg total) by mouth daily.  Dispense: 30 tablet; Refill: 5   Meds  ordered this encounter  Medications  . amLODipine (NORVASC) 10 MG tablet    Sig: Take 1 tablet (10 mg total) by mouth daily.    Dispense:  30 tablet    Refill:  5    Follow-up: Return in about 6 weeks (around 08/24/2016) for Follow-up on hypertension.   Jaclyn ShaggyEnobong Amao MD

## 2016-07-31 MED FILL — LOVASTATIN 20 MG TABLET: 20 | 30 days supply | Qty: 30 | Fill #2

## 2016-07-31 MED FILL — ?CETIRIZINE HCL 10 MG TABLE: 10 | 30 days supply | Qty: 30 | Fill #2

## 2016-08-17 MED FILL — AMLODIPINE BESYLATE 10 MG T: 10 | 30 days supply | Qty: 30 | Fill #1

## 2016-08-27 ENCOUNTER — Other Ambulatory Visit: Payer: Self-pay | Admitting: Family Medicine

## 2016-08-27 DIAGNOSIS — E7800 Pure hypercholesterolemia, unspecified: Secondary | ICD-10-CM

## 2016-08-29 MED FILL — IBUPROFEN 600 MG TABLET: 600 | 10 days supply | Qty: 30 | Fill #0

## 2016-09-13 MED FILL — ?AMLODIPINE BESYLATE 10 MG: 10 | 30 days supply | Qty: 30 | Fill #2

## 2016-09-14 ENCOUNTER — Ambulatory Visit: Payer: Self-pay | Admitting: Family Medicine

## 2016-09-24 ENCOUNTER — Other Ambulatory Visit: Payer: Self-pay | Admitting: Family Medicine

## 2016-09-24 DIAGNOSIS — E7800 Pure hypercholesterolemia, unspecified: Secondary | ICD-10-CM

## 2016-09-24 MED FILL — LOVASTATIN 20 MG TABLET: 20 | 30 days supply | Qty: 30 | Fill #3

## 2016-09-24 MED FILL — ?CETIRIZINE HCL 10 MG TABLE: 10 | 30 days supply | Qty: 30 | Fill #0

## 2016-10-17 MED FILL — LOVASTATIN 20 MG TABLET: 20 | 30 days supply | Qty: 30 | Fill #4

## 2016-11-01 ENCOUNTER — Other Ambulatory Visit: Payer: Self-pay | Admitting: Family Medicine

## 2016-11-01 DIAGNOSIS — E7800 Pure hypercholesterolemia, unspecified: Secondary | ICD-10-CM

## 2016-11-01 MED FILL — AMLODIPINE BESYLATE 10 MG T: 10 | 30 days supply | Qty: 30 | Fill #3

## 2016-11-01 MED FILL — ?CETIRIZINE HCL 10 MG TABLE: 10 | 30 days supply | Qty: 30 | Fill #1

## 2016-11-02 MED FILL — IBUPROFEN 600 MG TABLET: 600 | 10 days supply | Qty: 30 | Fill #0

## 2016-11-08 ENCOUNTER — Encounter: Payer: Self-pay | Admitting: Family Medicine

## 2016-11-08 ENCOUNTER — Ambulatory Visit: Payer: Self-pay | Attending: Family Medicine | Admitting: Family Medicine

## 2016-11-08 VITALS — BP 134/76 | HR 88 | Temp 98.3°F | Resp 18 | Ht 68.0 in | Wt 187.4 lb

## 2016-11-08 DIAGNOSIS — G8929 Other chronic pain: Secondary | ICD-10-CM | POA: Insufficient documentation

## 2016-11-08 DIAGNOSIS — I10 Essential (primary) hypertension: Secondary | ICD-10-CM | POA: Insufficient documentation

## 2016-11-08 DIAGNOSIS — Z131 Encounter for screening for diabetes mellitus: Secondary | ICD-10-CM | POA: Insufficient documentation

## 2016-11-08 DIAGNOSIS — Z79899 Other long term (current) drug therapy: Secondary | ICD-10-CM | POA: Insufficient documentation

## 2016-11-08 DIAGNOSIS — E7800 Pure hypercholesterolemia, unspecified: Secondary | ICD-10-CM | POA: Insufficient documentation

## 2016-11-08 DIAGNOSIS — M25511 Pain in right shoulder: Secondary | ICD-10-CM | POA: Insufficient documentation

## 2016-11-08 DIAGNOSIS — Z7982 Long term (current) use of aspirin: Secondary | ICD-10-CM | POA: Insufficient documentation

## 2016-11-08 DIAGNOSIS — R7303 Prediabetes: Secondary | ICD-10-CM | POA: Insufficient documentation

## 2016-11-08 LAB — POCT GLYCOSYLATED HEMOGLOBIN (HGB A1C): Hemoglobin A1C: 6.4

## 2016-11-08 LAB — GLUCOSE, POCT (MANUAL RESULT ENTRY)
POC Glucose: 247 mg/dl — AB (ref 70–99)
POC Glucose: 247 mg/dl — AB (ref 70–99)

## 2016-11-08 MED ORDER — AMLODIPINE BESYLATE 10 MG PO TABS: 10.0000 mg | ORAL_TABLET | Freq: Every day | ORAL | 5 refills | Status: DC

## 2016-11-08 MED ORDER — MELOXICAM 7.5 MG PO TABS: 7.5000 mg | ORAL_TABLET | Freq: Every day | ORAL | 3 refills | Status: DC

## 2016-11-08 MED ORDER — LOVASTATIN 20 MG PO TABS: 20.0000 mg | ORAL_TABLET | Freq: Every day | ORAL | 3 refills | Status: DC

## 2016-11-08 MED FILL — MELOXICAM 7.5 MG TABLET: 7.5 | 30 days supply | Qty: 30 | Fill #0

## 2016-11-08 MED FILL — ?LOVASTATIN 20MG TABLET: 20 | 30 days supply | Qty: 30 | Fill #0

## 2016-11-08 NOTE — Patient Instructions (Signed)
Shoulder Pain Many things can cause shoulder pain, including:  An injury to the area.  Overuse of the shoulder.  Arthritis. The source of the pain can be:  Inflammation.  An injury to the shoulder joint.  An injury to a tendon, ligament, or bone. Follow these instructions at home: Take these actions to help with your pain:  Squeeze a soft ball or a foam pad as much as possible. This helps to keep the shoulder from swelling. It also helps to strengthen the arm.  Take over-the-counter and prescription medicines only as told by your health care provider.  If directed, apply ice to the area:  Put ice in a plastic bag.  Place a towel between your skin and the bag.  Leave the ice on for 20 minutes, 2-3 times per day. Stop applying ice if it does not help with the pain.  If you were given a shoulder sling or immobilizer:  Wear it as told.  Remove it to shower or bathe.  Move your arm as little as possible, but keep your hand moving to prevent swelling. Contact a health care provider if:  Your pain gets worse.  Your pain is not relieved with medicines.  New pain develops in your arm, hand, or fingers. Get help right away if:  Your arm, hand, or fingers:  Tingle.  Become numb.  Become swollen.  Become painful.  Turn white or blue. This information is not intended to replace advice given to you by your health care provider. Make sure you discuss any questions you have with your health care provider. Document Released: 03/28/2005 Document Revised: 02/12/2016 Document Reviewed: 10/11/2014 Elsevier Interactive Patient Education  2017 [employer]  

## 2016-11-08 NOTE — Progress Notes (Signed)
Subjective:  Patient ID: Tyler Perry, male    DOB: 1955-12-29  Age: 61 y.o. MRN: [id number]  CC: Hypertension (Follow up)   HPI Tyler Perry is a 61 year old male with a history of hypertension, hyperlipidemia who presents today for a follow-up visit. He endorses compliance with his antihypertensive and low-sodium diet and his blood pressure is controlled today.  Endorses compliance with his statin and denies myalgias. Compliance with low-cholesterol diet.  He complains of right shoulder pain which is chronic and has been intermittent but current episode commenced 2 weeks ago after he did some yard work. Pain is in the anterior shoulder and worse with range of motion. He has received cortisone shots in the past with relief and current use of ibuprofen is not helping  Denies chest pains or shortness of breath.  Past Medical History:  Diagnosis Date  . Allergy   . Gout, unspecified   . Hernia   . Hyperlipidemia   . Insulin resistance   . Osteoarthrosis, unspecified whether generalized or localized, unspecified site     Past Surgical History:  Procedure Laterality Date  . HERNIA REPAIR  2003   umb hernia    No Known Allergies   Outpatient Medications Prior to Visit  Medication Sig Dispense Refill  . aspirin 81 MG tablet Take 81 mg by mouth daily.      Marland Kitchen azelastine (ASTELIN) 137 MCG/SPRAY nasal spray BID times 48H.    . cetirizine (ZYRTEC) 10 MG tablet TAKE 1 TABLET BY MOUTH DAILY. 30 tablet 2  . ibuprofen (ADVIL,MOTRIN) 600 MG tablet TAKE 1 TABLET BY MOUTH EVERY 8 HOURS AS NEEDED. 30 tablet 0  . Multiple Vitamin (MULTIVITAMIN) tablet Take 1 tablet by mouth daily.      Marland Kitchen amLODipine (NORVASC) 10 MG tablet Take 1 tablet (10 mg total) by mouth daily. 30 tablet 5  . lovastatin (MEVACOR) 20 MG tablet Take 1 tablet (20 mg total) by mouth at bedtime. 90 tablet 3   No facility-administered medications prior to visit.     ROS Review of Systems  Constitutional:  Negative for activity change and appetite change.  HENT: Negative for sinus pressure and sore throat.   Eyes: Negative for visual disturbance.  Respiratory: Negative for cough, chest tightness and shortness of breath.   Cardiovascular: Negative for chest pain and leg swelling.  Gastrointestinal: Negative for abdominal distention, abdominal pain, constipation and diarrhea.  Endocrine: Negative.   Genitourinary: Negative for dysuria.  Musculoskeletal:       See hpi  Skin: Negative for rash.  Allergic/Immunologic: Negative.   Neurological: Negative for weakness, light-headedness and numbness.  Psychiatric/Behavioral: Negative for dysphoric mood and suicidal ideas.    Objective:  BP 134/76 (BP Location: Left Arm, Patient Position: Sitting, Cuff Size: Large)   Pulse 88   Temp 98.3 F (36.8 C) (Oral)   Resp 18   Ht 5' 8"  (1.727 m)   Wt 187 lb 6.4 oz (85 kg)   SpO2 97%   BMI 28.49 kg/m   BP/Weight 11/08/2016 07/13/2016 01/77/9390  Systolic BP 300 923 300  Diastolic BP 76 87 80  Wt. (Lbs) 187.4 186.6 191  BMI 28.49 28.37 29.04     Physical Exam  Constitutional: He is oriented to person, place, and time. He appears well-developed and well-nourished.  Cardiovascular: Normal rate, normal heart sounds and intact distal pulses.   No murmur heard. Pulmonary/Chest: Effort normal and breath sounds normal. He has no wheezes. He has no rales. He exhibits  no tenderness.  Abdominal: Soft. Bowel sounds are normal. He exhibits no distension and no mass. There is no tenderness.  Musculoskeletal: Normal range of motion. He exhibits tenderness (tenderness on palpation of the anterior aspect of right shoulder but no tenderness on overhead stretching. Positive Hawkins sign on the right). He exhibits no edema.  Neurological: He is alert and oriented to person, place, and time.     Assessment & Plan:   1. Diabetes mellitus screening - POCT glycosylated hemoglobin (Hb A1C) - POCT glucose (manual  entry)  2. Prediabetes Educated about pre diabetes with hemoglobin A1c of 6.4 We'll try dietary modifications for now. Discussed dietary measures, last for modifications to prevent development of diabetes Repeat A1c in 3 months and determine if pharmacological therapy will be indicated.  3. Essential hypertension Controlled - amLODipine (NORVASC) 10 MG tablet; Take 1 tablet (10 mg total) by mouth daily.  Dispense: 30 tablet; Refill: 5 - CMP14+EGFR; Future  4. Pure hypercholesterolemia Uncontrolled Low-cholesterol diet Repeat lipids today. - lovastatin (MEVACOR) 20 MG tablet; Take 1 tablet (20 mg total) by mouth at bedtime.  Dispense: 90 tablet; Refill: 3 - Lipid panel; Future  5. Chronic right shoulder pain We'll reassess at next visit and determine if cortisone injections are needed. - meloxicam (MOBIC) 7.5 MG tablet; Take 1 tablet (7.5 mg total) by mouth daily.  Dispense: 30 tablet; Refill: 3   Meds ordered this encounter  Medications  . amLODipine (NORVASC) 10 MG tablet    Sig: Take 1 tablet (10 mg total) by mouth daily.    Dispense:  30 tablet    Refill:  5  . lovastatin (MEVACOR) 20 MG tablet    Sig: Take 1 tablet (20 mg total) by mouth at bedtime.    Dispense:  90 tablet    Refill:  3  . meloxicam (MOBIC) 7.5 MG tablet    Sig: Take 1 tablet (7.5 mg total) by mouth daily.    Dispense:  30 tablet    Refill:  3    Follow-up: Return in about 1 month (around 12/09/2016) for Follow-up shoulder pain.   Arnoldo Morale MD

## 2016-11-14 ENCOUNTER — Ambulatory Visit: Payer: Self-pay | Attending: Family Medicine

## 2016-11-14 DIAGNOSIS — E7800 Pure hypercholesterolemia, unspecified: Secondary | ICD-10-CM | POA: Insufficient documentation

## 2016-11-14 DIAGNOSIS — I10 Essential (primary) hypertension: Secondary | ICD-10-CM | POA: Insufficient documentation

## 2016-11-14 NOTE — Progress Notes (Signed)
Patient here for lab visit  

## 2016-11-15 LAB — CMP14+EGFR
ALT: 26 IU/L (ref 0–44)
AST: 24 IU/L (ref 0–40)
Albumin/Globulin Ratio: 1.4 (ref 1.2–2.2)
Albumin: 4.4 g/dL (ref 3.5–5.5)
Alkaline Phosphatase: 118 IU/L — ABNORMAL HIGH (ref 39–117)
BUN/Creatinine Ratio: 14 (ref 9–20)
BUN: 13 mg/dL (ref 6–24)
Bilirubin Total: 0.6 mg/dL (ref 0.0–1.2)
CO2: 24 mmol/L (ref 18–29)
Calcium: 9.6 mg/dL (ref 8.7–10.2)
Chloride: 98 mmol/L (ref 96–106)
Creatinine, Ser: 0.94 mg/dL (ref 0.76–1.27)
GFR calc Af Amer: 102 mL/min/{1.73_m2} (ref >59)
GFR calc non Af Amer: 88 mL/min/{1.73_m2} (ref >59)
Globulin, Total: 3.1 g/dL (ref 1.5–4.5)
Glucose: 142 mg/dL — ABNORMAL HIGH (ref 65–99)
Potassium: 4.5 mmol/L (ref 3.5–5.2)
Sodium: 140 mmol/L (ref 134–144)
Total Protein: 7.5 g/dL (ref 6.0–8.5)

## 2016-11-15 LAB — LIPID PANEL
Chol/HDL Ratio: 3.7 ratio (ref 0.0–5.0)
Cholesterol, Total: 198 mg/dL (ref 100–199)
HDL: 54 mg/dL (ref >39)
LDL Calculated: 122 mg/dL — ABNORMAL HIGH (ref 0–99)
Triglycerides: 109 mg/dL (ref 0–149)
VLDL Cholesterol Cal: 22 mg/dL (ref 5–40)

## 2016-11-19 ENCOUNTER — Telehealth: Payer: Self-pay | Admitting: *Deleted

## 2016-11-19 NOTE — Telephone Encounter (Signed)
-----   Message from Jaclyn ShaggyEnobong Amao, MD sent at 11/15/2016 12:04 PM EDT ----- Cholesterol has improved compared to last set of labs. Please advise to adhere to a low-cholesterol diet and exercise.

## 2016-11-19 NOTE — Telephone Encounter (Signed)
Patient verified DOB Patient is aware of cholesterol level improving compared to last set. Patient advised to continue with lowering medication and implementing nutritional changes and exercise. Patient expressed his understanding and had no further questions at this time.

## 2016-12-10 ENCOUNTER — Other Ambulatory Visit: Payer: Self-pay | Admitting: Family Medicine

## 2016-12-10 DIAGNOSIS — E7800 Pure hypercholesterolemia, unspecified: Secondary | ICD-10-CM

## 2016-12-10 MED FILL — MELOXICAM 7.5 MG TABLET: 7.5 | 30 days supply | Qty: 30 | Fill #1

## 2016-12-10 MED FILL — ?CETIRIZINE HCL 10 MG TABLE: 10 | 30 days supply | Qty: 30 | Fill #2

## 2016-12-10 MED FILL — AMLODIPINE BESYLATE 10 MG T: 10 | 30 days supply | Qty: 30 | Fill #4

## 2016-12-31 MED FILL — LOVASTATIN 20 MG TABLET: 20 | 30 days supply | Qty: 30 | Fill #1

## 2017-01-10 ENCOUNTER — Other Ambulatory Visit: Payer: Self-pay | Admitting: Family Medicine

## 2017-01-10 DIAGNOSIS — E7800 Pure hypercholesterolemia, unspecified: Secondary | ICD-10-CM

## 2017-01-10 DIAGNOSIS — I10 Essential (primary) hypertension: Secondary | ICD-10-CM

## 2017-01-10 MED FILL — MELOXICAM 7.5 MG TABLET: 7.5 | 30 days supply | Qty: 30 | Fill #2

## 2017-01-10 MED FILL — AMLODIPINE BESYLATE 10 MG T: 10 | 30 days supply | Qty: 30 | Fill #5

## 2017-01-10 MED FILL — ?CETIRIZINE HCL 10 MG TABLE: 10 | 30 days supply | Qty: 30 | Fill #0

## 2017-01-28 MED FILL — LOVASTATIN 20 MG TABLET: 20 | 30 days supply | Qty: 30 | Fill #2

## 2017-02-15 ENCOUNTER — Other Ambulatory Visit: Payer: Self-pay | Admitting: Family Medicine

## 2017-02-15 DIAGNOSIS — M25511 Pain in right shoulder: Principal | ICD-10-CM

## 2017-02-15 DIAGNOSIS — G8929 Other chronic pain: Secondary | ICD-10-CM

## 2017-02-15 MED FILL — AMLODIPINE BESYLATE 10 MG T: 10 | 30 days supply | Qty: 30 | Fill #0

## 2017-02-15 MED FILL — MELOXICAM 7.5 MG TABLET: 7.5 | 30 days supply | Qty: 30 | Fill #3

## 2017-02-15 MED FILL — ?CETIRIZINE HCL 10 MG TABLE: 10 | 30 days supply | Qty: 30 | Fill #1

## 2017-03-14 ENCOUNTER — Other Ambulatory Visit: Payer: Self-pay | Admitting: Family Medicine

## 2017-03-14 DIAGNOSIS — M25511 Pain in right shoulder: Principal | ICD-10-CM

## 2017-03-14 DIAGNOSIS — G8929 Other chronic pain: Secondary | ICD-10-CM

## 2017-03-14 MED FILL — LOVASTATIN 20 MG TABLET: 20 | 30 days supply | Qty: 30 | Fill #3

## 2017-03-14 MED FILL — AMLODIPINE BESYLATE 10 MG T: 10 | 30 days supply | Qty: 30 | Fill #1

## 2017-03-18 MED FILL — MELOXICAM 7.5 MG TABLET: 7.5 | 30 days supply | Qty: 30 | Fill #0

## 2017-03-22 ENCOUNTER — Encounter: Payer: Self-pay | Admitting: Family Medicine

## 2017-03-22 ENCOUNTER — Ambulatory Visit: Payer: Self-pay | Attending: Family Medicine | Admitting: Family Medicine

## 2017-03-22 VITALS — BP 148/74 | HR 87 | Temp 98.2°F | Ht 68.0 in | Wt 188.6 lb

## 2017-03-22 DIAGNOSIS — I10 Essential (primary) hypertension: Secondary | ICD-10-CM | POA: Insufficient documentation

## 2017-03-22 DIAGNOSIS — E785 Hyperlipidemia, unspecified: Secondary | ICD-10-CM | POA: Insufficient documentation

## 2017-03-22 DIAGNOSIS — Z9889 Other specified postprocedural states: Secondary | ICD-10-CM | POA: Insufficient documentation

## 2017-03-22 DIAGNOSIS — Z791 Long term (current) use of non-steroidal anti-inflammatories (NSAID): Secondary | ICD-10-CM | POA: Insufficient documentation

## 2017-03-22 DIAGNOSIS — Z7982 Long term (current) use of aspirin: Secondary | ICD-10-CM | POA: Insufficient documentation

## 2017-03-22 DIAGNOSIS — M1990 Unspecified osteoarthritis, unspecified site: Secondary | ICD-10-CM | POA: Insufficient documentation

## 2017-03-22 DIAGNOSIS — B351 Tinea unguium: Secondary | ICD-10-CM | POA: Insufficient documentation

## 2017-03-22 DIAGNOSIS — E7800 Pure hypercholesterolemia, unspecified: Secondary | ICD-10-CM | POA: Insufficient documentation

## 2017-03-22 DIAGNOSIS — M79674 Pain in right toe(s): Secondary | ICD-10-CM | POA: Insufficient documentation

## 2017-03-22 DIAGNOSIS — Z79899 Other long term (current) drug therapy: Secondary | ICD-10-CM | POA: Insufficient documentation

## 2017-03-22 DIAGNOSIS — M109 Gout, unspecified: Secondary | ICD-10-CM | POA: Insufficient documentation

## 2017-03-22 MED ORDER — AMLODIPINE BESYLATE 10 MG PO TABS: 10.0000 mg | ORAL_TABLET | Freq: Every day | ORAL | 6 refills | Status: AC

## 2017-03-22 MED ORDER — CICLOPIROX 8 % EX SOLN: CUTANEOUS | 1 refills | Status: AC

## 2017-03-22 MED ORDER — LOVASTATIN 20 MG PO TABS: 20.0000 mg | ORAL_TABLET | Freq: Every day | ORAL | 6 refills | Status: AC

## 2017-03-22 NOTE — Patient Instructions (Signed)

## 2017-03-22 NOTE — Progress Notes (Signed)
Subjective:  Patient ID: Tyler Perry, male    DOB: [birth year]  Age: 61 y.o. MRN: [id number]  CC: Toe Pain   HPI [name]  is a 61 year old male with a history of hypertension, hyperlipidemia who presents today for a follow-up visit.  His blood pressure is slighlty elevated today despite the fact that he endorses compliance with his antihypertensive and low-sodium diet. He has been complaint with his statin and denies any myalgias.  He has missed work due to right 3rd toe pain from sustained trauma however he informs me pain has resolved today but he is needing a note for work. He would also like something to treat the discoloration of one of his toe nails.  Past Medical History:  Diagnosis Date  . Allergy   . Gout, unspecified   . Hernia   . Hyperlipidemia   . Insulin resistance   . Osteoarthrosis, unspecified whether generalized or localized, unspecified site     Past Surgical History:  Procedure Laterality Date  . HERNIA REPAIR  2003   umb hernia    No Known Allergies   Outpatient Medications Prior to Visit  Medication Sig Dispense Refill  . aspirin 81 MG tablet Take 81 mg by mouth daily.      . cetirizine (ZYRTEC) 10 MG tablet TAKE 1 TABLET BY MOUTH DAILY. 30 tablet 2  . ibuprofen (ADVIL,MOTRIN) 600 MG tablet TAKE 1 TABLET BY MOUTH EVERY 8 HOURS AS NEEDED. 30 tablet 0  . meloxicam (MOBIC) 7.5 MG tablet TAKE 1 TABLET BY MOUTH DAILY. 30 tablet 3  . Multiple Vitamin (MULTIVITAMIN) tablet Take 1 tablet by mouth daily.      Marland Kitchen amLODipine (NORVASC) 10 MG tablet Take 1 tablet (10 mg total) by mouth daily. 30 tablet 5  . lovastatin (MEVACOR) 20 MG tablet Take 1 tablet (20 mg total) by mouth at bedtime. 90 tablet 3  . azelastine (ASTELIN) 137 MCG/SPRAY nasal spray BID times 48H.     No facility-administered medications prior to visit.     ROS Review of Systems  Constitutional: Negative for activity change and appetite change.  HENT: Negative for sinus  pressure and sore throat.   Eyes: Negative for visual disturbance.  Respiratory: Negative for cough, chest tightness and shortness of breath.   Cardiovascular: Negative for chest pain and leg swelling.  Gastrointestinal: Negative for abdominal distention, abdominal pain, constipation and diarrhea.  Endocrine: Negative.   Genitourinary: Negative for dysuria.  Musculoskeletal: Negative for joint swelling and myalgias.  Skin: Negative for rash.  Allergic/Immunologic: Negative.   Neurological: Negative for weakness, light-headedness and numbness.  Psychiatric/Behavioral: Negative for dysphoric mood and suicidal ideas.    Objective:  BP (!) 148/74   Pulse 87   Temp 98.2 F (36.8 C) (Oral)   Ht  (1.727 m)   Wt 188 lb 9.6 oz (85.5 kg)   SpO2 98%   BMI 28.68 kg/m   BP/Weight 03/22/2017 11/08/2016 07/13/2016  Systolic BP 148 134 164  Diastolic BP 74 76 87  Wt. (Lbs) 188.6 187.4 186.6  BMI 28.68 28.49 28.37      Physical Exam  Constitutional: He is oriented to person, place, and time. He appears well-developed and well-nourished.  Cardiovascular: Normal rate, normal heart sounds and intact distal pulses.   No murmur heard. Pulmonary/Chest: Effort normal and breath sounds normal. He has no wheezes. He has no rales. He exhibits no tenderness.  Abdominal: Soft. Bowel sounds are normal. He exhibits no distension and no  mass. There is no tenderness.  Musculoskeletal: Normal range of motion.  Neurological: He is alert and oriented to person, place, and time.  Skin:  Right 2nd toenail discoloration     CMP Latest Ref Rng & Units 11/14/2016 05/11/2016  Glucose 65 - 99 mg/dL 098(J) 191(Y)  BUN 6 - 24 mg/dL 13 12  Creatinine 7.82 - 1.27 mg/dL 9.56 2.13  Sodium 086 - 144 mmol/L 140 137  Potassium 3.5 - 5.2 mmol/L 4.5 4.9  Chloride 96 - 106 mmol/L 98 100  CO2 18 - 29 mmol/L 24 27  Calcium 8.7 - 10.2 mg/dL 9.6 9.6  Total Protein 6.0 - 8.5 g/dL 7.5 7.7  Total Bilirubin 0.0 - 1.2  mg/dL 0.6 0.6  Alkaline Phos 39 - 117 IU/L 118(H) 98  AST 0 - 40 IU/L 24 21  ALT 0 - 44 IU/L 26 23    Lipid Panel     Component Value Date/Time   CHOL 198 11/14/2016 0902   TRIG 109 11/14/2016 0902   HDL 54 11/14/2016 0902   CHOLHDL 3.7 11/14/2016 0902   CHOLHDL 4.4 05/11/2016 1101   VLDL 12 05/11/2016 1101   LDLCALC 122 (H) 11/14/2016 0902    Assessment & Plan:   1. Essential hypertension Slightly elevated above goal of <130/80 Low sodium , DASH diet, lifestyle modifications No medication changes today, will reassess at next OV - amLODipine (NORVASC) 10 MG tablet; Take 1 tablet (10 mg total) by mouth daily.  Dispense: 30 tablet; Refill: 6 - Basic Metabolic Panel  2. Pure hypercholesterolemia Controlled Low cholesterol diet - lovastatin (MEVACOR) 20 MG tablet; Take 1 tablet (20 mg total) by mouth at bedtime.  Dispense: 30 tablet; Refill: 6  3. Onychomycosis Placed on [place]   Meds ordered this encounter  Medications  . amLODipine (NORVASC) 10 MG tablet    Sig: Take 1 tablet (10 mg total) by mouth daily.    Dispense:  30 tablet    Refill:  6  . lovastatin (MEVACOR) 20 MG tablet    Sig: Take 1 tablet (20 mg total) by mouth at bedtime.    Dispense:  30 tablet    Refill:  6  . ciclopirox (PENLAC) 8 % solution    Sig: Apply over nail and surrounding skin. Apply daily over previous coat. After seven (7) days, may remove with alcohol and continue cycle.    Dispense:  6.6 mL    Refill:  1    Follow-up: Return in about 6 months (around 09/19/2017) for Follow-up of hypertension and hyperlipidemia.   Jaclyn Shaggy MD

## 2017-03-23 LAB — BASIC METABOLIC PANEL
BUN/Creatinine Ratio: 15 (ref 9–20)
BUN: 19 mg/dL (ref 6–24)
CO2: 24 mmol/L (ref 20–29)
Calcium: 10.1 mg/dL (ref 8.7–10.2)
Chloride: 103 mmol/L (ref 96–106)
Creatinine, Ser: 1.25 mg/dL (ref 0.76–1.27)
GFR calc Af Amer: 72 mL/min/{1.73_m2} (ref >59)
GFR calc non Af Amer: 63 mL/min/{1.73_m2} (ref >59)
Glucose: 86 mg/dL (ref 65–99)
Potassium: 4.6 mmol/L (ref 3.5–5.2)
Sodium: 144 mmol/L (ref 134–144)

## 2017-03-25 ENCOUNTER — Telehealth: Payer: Self-pay

## 2017-03-25 NOTE — Telephone Encounter (Signed)
Pt was called and informed of lab results. 

## 2017-05-02 DEATH — deceased
